# Patient Record
Sex: Male | Born: 1960 | Race: White | Hispanic: No | Marital: Married | Smoking: Never smoker
Health system: Southern US, Community
[De-identification: ages and names within clinical notes are randomized; demographics above are authoritative.]

## PROBLEM LIST (undated history)

## (undated) DIAGNOSIS — T82198A Other mechanical complication of other cardiac electronic device, initial encounter: Secondary | ICD-10-CM

## (undated) DIAGNOSIS — I447 Left bundle-branch block, unspecified: Secondary | ICD-10-CM

## (undated) DIAGNOSIS — K7689 Other specified diseases of liver: Secondary | ICD-10-CM

## (undated) DIAGNOSIS — F1010 Alcohol abuse, uncomplicated: Secondary | ICD-10-CM

## (undated) DIAGNOSIS — I428 Other cardiomyopathies: Secondary | ICD-10-CM

## (undated) DIAGNOSIS — I5022 Chronic systolic (congestive) heart failure: Secondary | ICD-10-CM

## (undated) DIAGNOSIS — R001 Bradycardia, unspecified: Secondary | ICD-10-CM

## (undated) DIAGNOSIS — Z95810 Presence of automatic (implantable) cardiac defibrillator: Secondary | ICD-10-CM

## (undated) DIAGNOSIS — I10 Essential (primary) hypertension: Secondary | ICD-10-CM

## (undated) HISTORY — DX: Bradycardia, unspecified: R00.1

## (undated) HISTORY — DX: Chronic systolic (congestive) heart failure: I50.22

## (undated) HISTORY — DX: Other mechanical complication of other cardiac electronic device, initial encounter: T82.198A

## (undated) HISTORY — DX: Presence of automatic (implantable) cardiac defibrillator: Z95.810

## (undated) HISTORY — DX: Alcohol abuse, uncomplicated: F10.10

## (undated) HISTORY — DX: Left bundle-branch block, unspecified: I44.7

## (undated) HISTORY — DX: Other specified diseases of liver: K76.89

## (undated) HISTORY — DX: Other cardiomyopathies: I42.8

## (undated) HISTORY — DX: Essential (primary) hypertension: I10

---

## 2002-09-12 ENCOUNTER — Ambulatory Visit (HOSPITAL_COMMUNITY): Admission: RE | Admit: 2002-09-12 | Discharge: 2002-09-12 | Payer: Self-pay | Admitting: Cardiology

## 2002-09-12 HISTORY — PX: CARDIAC CATHETERIZATION: SHX172

## 2004-06-16 ENCOUNTER — Ambulatory Visit (HOSPITAL_COMMUNITY): Admission: AD | Admit: 2004-06-16 | Discharge: 2004-06-16 | Payer: Self-pay | Admitting: Internal Medicine

## 2004-10-13 ENCOUNTER — Ambulatory Visit: Payer: Self-pay | Admitting: Internal Medicine

## 2004-11-11 ENCOUNTER — Ambulatory Visit: Payer: Self-pay | Admitting: Cardiology

## 2005-01-10 ENCOUNTER — Ambulatory Visit: Payer: Self-pay

## 2005-03-16 ENCOUNTER — Ambulatory Visit: Payer: Self-pay | Admitting: Internal Medicine

## 2005-04-12 ENCOUNTER — Ambulatory Visit: Payer: Self-pay | Admitting: Cardiology

## 2005-04-12 ENCOUNTER — Ambulatory Visit: Payer: Self-pay

## 2005-06-28 ENCOUNTER — Ambulatory Visit: Payer: Self-pay | Admitting: Cardiology

## 2005-11-16 ENCOUNTER — Ambulatory Visit: Payer: Self-pay | Admitting: Cardiology

## 2006-03-15 ENCOUNTER — Ambulatory Visit: Payer: Self-pay | Admitting: *Deleted

## 2006-07-12 ENCOUNTER — Ambulatory Visit: Payer: Self-pay | Admitting: Cardiology

## 2006-11-14 ENCOUNTER — Ambulatory Visit: Payer: Self-pay | Admitting: Cardiovascular Disease

## 2006-11-22 ENCOUNTER — Ambulatory Visit: Payer: Self-pay | Admitting: Internal Medicine

## 2006-12-18 ENCOUNTER — Ambulatory Visit: Payer: Self-pay | Admitting: Cardiology

## 2006-12-18 LAB — CONVERTED CEMR LAB
AST: 60 units/L — ABNORMAL HIGH (ref 0–37)
Calcium: 9.5 mg/dL (ref 8.4–10.5)
Chloride: 102 meq/L (ref 96–112)
Glomerular Filtration Rate, Af Am: 94 mL/min/{1.73_m2}
Glucose, Bld: 115 mg/dL — ABNORMAL HIGH (ref 70–99)
Sodium: 138 meq/L (ref 135–145)
TSH: 1.46 microintl units/mL (ref 0.35–5.50)

## 2007-01-15 ENCOUNTER — Encounter: Payer: Self-pay | Admitting: Cardiology

## 2007-01-15 ENCOUNTER — Ambulatory Visit: Payer: Self-pay

## 2007-03-14 ENCOUNTER — Ambulatory Visit: Payer: Self-pay | Admitting: Internal Medicine

## 2007-03-22 ENCOUNTER — Ambulatory Visit: Payer: Self-pay

## 2007-05-10 ENCOUNTER — Ambulatory Visit: Payer: Self-pay | Admitting: Cardiology

## 2007-06-04 ENCOUNTER — Ambulatory Visit: Payer: Self-pay | Admitting: Cardiology

## 2007-07-11 ENCOUNTER — Ambulatory Visit: Payer: Self-pay | Admitting: Cardiology

## 2007-07-11 LAB — CONVERTED CEMR LAB
ALT: 155 units/L — ABNORMAL HIGH (ref 0–53)
Albumin: 3.7 g/dL (ref 3.5–5.2)
Alkaline Phosphatase: 41 units/L (ref 39–117)
BUN: 14 mg/dL (ref 6–23)
Bilirubin, Direct: 0.1 mg/dL (ref 0.0–0.3)
CO2: 29 meq/L (ref 19–32)
Calcium: 8.9 mg/dL (ref 8.4–10.5)
Chloride: 102 meq/L (ref 96–112)
GFR calc Af Amer: 105 mL/min
Glucose, Bld: 117 mg/dL — ABNORMAL HIGH (ref 70–99)
HDL: 31.4 mg/dL — ABNORMAL LOW (ref >39.0)
Total CHOL/HDL Ratio: 8.2
Total Protein: 6.9 g/dL (ref 6.0–8.3)
VLDL: 161 mg/dL — ABNORMAL HIGH (ref 0–40)

## 2007-08-09 ENCOUNTER — Ambulatory Visit: Payer: Self-pay | Admitting: Cardiology

## 2007-08-20 ENCOUNTER — Ambulatory Visit: Payer: Self-pay | Admitting: Gastroenterology

## 2007-08-20 LAB — CONVERTED CEMR LAB
AST: 105 units/L — ABNORMAL HIGH (ref 0–37)
Albumin: 4.1 g/dL (ref 3.5–5.2)
Angiotensin 1 Converting Enzyme: 13 units/L (ref 9–67)
Anti Nuclear Antibody(ANA): NEGATIVE
Ferritin: 512.6 ng/mL — ABNORMAL HIGH (ref 22.0–322.0)
Hep B S Ab: NEGATIVE
Hepatitis B Surface Ag: NEGATIVE
Saturation Ratios: 20.4 % (ref 20.0–50.0)
Total Protein: 7.5 g/dL (ref 6.0–8.3)
Transferrin: 266.6 mg/dL (ref >=212.0)

## 2007-08-22 ENCOUNTER — Ambulatory Visit (HOSPITAL_COMMUNITY): Admission: RE | Admit: 2007-08-22 | Discharge: 2007-08-22 | Payer: Self-pay | Admitting: Gastroenterology

## 2007-09-11 ENCOUNTER — Ambulatory Visit: Payer: Self-pay | Admitting: Cardiology

## 2007-09-17 ENCOUNTER — Ambulatory Visit: Payer: Self-pay | Admitting: Gastroenterology

## 2007-10-10 ENCOUNTER — Ambulatory Visit: Payer: Self-pay | Admitting: Cardiology

## 2007-10-22 ENCOUNTER — Encounter: Admission: RE | Admit: 2007-10-22 | Discharge: 2007-10-22 | Payer: Self-pay | Admitting: Gastroenterology

## 2007-11-07 ENCOUNTER — Ambulatory Visit: Payer: Self-pay | Admitting: Cardiology

## 2007-11-07 ENCOUNTER — Ambulatory Visit: Payer: Self-pay | Admitting: Internal Medicine

## 2007-12-13 ENCOUNTER — Ambulatory Visit: Payer: Self-pay | Admitting: Cardiology

## 2008-01-10 ENCOUNTER — Ambulatory Visit: Payer: Self-pay | Admitting: Cardiology

## 2008-02-07 ENCOUNTER — Ambulatory Visit: Payer: Self-pay | Admitting: Cardiology

## 2008-02-14 ENCOUNTER — Ambulatory Visit: Payer: Self-pay | Admitting: Cardiology

## 2008-02-14 DIAGNOSIS — I509 Heart failure, unspecified: Secondary | ICD-10-CM | POA: Insufficient documentation

## 2008-02-14 DIAGNOSIS — I10 Essential (primary) hypertension: Secondary | ICD-10-CM

## 2008-02-14 DIAGNOSIS — K7689 Other specified diseases of liver: Secondary | ICD-10-CM

## 2008-02-14 DIAGNOSIS — I428 Other cardiomyopathies: Secondary | ICD-10-CM | POA: Insufficient documentation

## 2008-02-14 DIAGNOSIS — I447 Left bundle-branch block, unspecified: Secondary | ICD-10-CM | POA: Insufficient documentation

## 2008-02-14 HISTORY — DX: Other specified diseases of liver: K76.89

## 2008-02-14 LAB — CONVERTED CEMR LAB
Calcium: 9.5 mg/dL (ref 8.4–10.5)
Chloride: 102 meq/L (ref 96–112)
Creatinine, Ser: 1.1 mg/dL (ref 0.4–1.5)
GFR calc Af Amer: 94 mL/min
Potassium: 4.3 meq/L (ref 3.5–5.1)
Sodium: 139 meq/L (ref 135–145)

## 2008-03-05 ENCOUNTER — Ambulatory Visit: Payer: Self-pay | Admitting: Cardiology

## 2008-04-15 ENCOUNTER — Ambulatory Visit: Payer: Self-pay | Admitting: Cardiology

## 2008-05-07 ENCOUNTER — Ambulatory Visit: Payer: Self-pay

## 2008-05-13 ENCOUNTER — Ambulatory Visit: Payer: Self-pay | Admitting: Cardiology

## 2008-06-04 ENCOUNTER — Ambulatory Visit: Payer: Self-pay | Admitting: Cardiology

## 2008-07-02 ENCOUNTER — Ambulatory Visit: Payer: Self-pay | Admitting: Cardiology

## 2008-08-06 ENCOUNTER — Ambulatory Visit: Payer: Self-pay | Admitting: Cardiology

## 2008-09-03 ENCOUNTER — Ambulatory Visit: Payer: Self-pay | Admitting: Cardiology

## 2008-10-07 ENCOUNTER — Ambulatory Visit: Payer: Self-pay | Admitting: Cardiology

## 2008-10-07 ENCOUNTER — Ambulatory Visit: Payer: Self-pay | Admitting: Internal Medicine

## 2008-10-10 ENCOUNTER — Encounter: Payer: Self-pay | Admitting: Gastroenterology

## 2008-11-13 ENCOUNTER — Ambulatory Visit: Payer: Self-pay | Admitting: Cardiology

## 2008-11-13 LAB — CONVERTED CEMR LAB
CO2: 28 meq/L (ref 19–32)
Calcium: 9.2 mg/dL (ref 8.4–10.5)
Sodium: 138 meq/L (ref 135–145)

## 2008-12-17 ENCOUNTER — Encounter: Payer: Self-pay | Admitting: Cardiology

## 2008-12-17 ENCOUNTER — Ambulatory Visit: Payer: Self-pay

## 2009-01-15 ENCOUNTER — Encounter: Payer: Self-pay | Admitting: Internal Medicine

## 2009-05-05 ENCOUNTER — Telehealth: Payer: Self-pay | Admitting: Cardiology

## 2009-06-18 DIAGNOSIS — F1010 Alcohol abuse, uncomplicated: Secondary | ICD-10-CM | POA: Insufficient documentation

## 2009-06-18 HISTORY — DX: Alcohol abuse, uncomplicated: F10.10

## 2009-06-19 ENCOUNTER — Ambulatory Visit: Payer: Self-pay | Admitting: Cardiology

## 2009-12-18 ENCOUNTER — Ambulatory Visit: Payer: Self-pay | Admitting: Internal Medicine

## 2010-01-08 ENCOUNTER — Ambulatory Visit (HOSPITAL_COMMUNITY)
Admission: RE | Admit: 2010-01-08 | Discharge: 2010-01-08 | Payer: Self-pay | Source: Home / Self Care | Admitting: Internal Medicine

## 2010-01-08 ENCOUNTER — Ambulatory Visit: Payer: Self-pay

## 2010-01-08 ENCOUNTER — Encounter: Payer: Self-pay | Admitting: Internal Medicine

## 2010-01-08 ENCOUNTER — Ambulatory Visit: Payer: Self-pay | Admitting: Cardiovascular Disease

## 2010-01-11 ENCOUNTER — Telehealth (INDEPENDENT_AMBULATORY_CARE_PROVIDER_SITE_OTHER): Payer: Self-pay | Admitting: *Deleted

## 2010-01-21 ENCOUNTER — Ambulatory Visit: Payer: Self-pay | Admitting: Cardiology

## 2010-09-03 ENCOUNTER — Encounter (INDEPENDENT_AMBULATORY_CARE_PROVIDER_SITE_OTHER): Payer: Self-pay | Admitting: *Deleted

## 2010-12-08 ENCOUNTER — Ambulatory Visit: Admission: RE | Admit: 2010-12-08 | Discharge: 2010-12-08 | Payer: Self-pay | Source: Home / Self Care

## 2011-01-04 NOTE — Assessment & Plan Note (Signed)
Summary: 3 mo f/u   CC:  3 month rov.  Marland Kitchen  History of Present Illness: Mr. Cutbirth is seen in follow up   nonischemic cardiomyopathy congestive heart failure with LBBB for which he implanted, for primary prevention, an  ICD. Prior cardiac catheterization in October of 2003 revealed an ejection fraction of 10% and normal coronary arteries. . His most recent echocardiogram was performed in January of 2010. Ejection fraction was 25-30%. He continues with mild dyspnea on exertion.  there has been no edema and no chest pain. Marland Kitchen  His ICD has not fired.  Current Medications (verified): 1)  Ramipril 10 Mg Caps (Ramipril) .Marland Kitchen.. 1 Tab By Mouth Two Times A Day 2)  Spironolactone 25 Mg Tabs (Spironolactone) .... Take One Tablet By Mouth Daily 3)  Lanoxin 0.25 Mg Tabs (Digoxin) .Marland Kitchen.. 1 Tab By Mouth Once Daily 4)  Metoprolol Tartrate 100 Mg Tabs (Metoprolol Tartrate) .Marland Kitchen.. 1 1/2 Tab By Mouth Two Times A Day 5)  Aspirin Ec 325 Mg Tbec (Aspirin) .... Take One Tablet By Mouth Daily 6)  Wellbutrin Xl 150 Mg Xr24h-Tab (Bupropion Hcl) .... Take 1 Tablet By Mouth Once A Day 7)  Lovaza 1 Gm Caps (Omega-3-Acid Ethyl Esters) .... Take Two Capsules Two Times A Day 8)  Welchol 3.75 Gm Pack (Colesevelam Hcl) .... Take 3 Capsules Two Times A Day  Allergies (verified): 1)  ! Pcn  Past History:  Past Medical History: Last updated: 06/18/2009 Current Problems:  HYPERTENSION (ICD-401.9) CONGESTIVE HEART FAILURE, SYSTOLIC, CHRONIC (ICD-428.0) BUNDLE BRANCH BLOCK, LEFT (ICD-426.3) CARDIOMYOPATHY (ICD-425.4) FATTY LIVER DISEASE (ICD-571.8) AICD-Medtronic Maximo 7232  Past Surgical History: Last updated: 06/18/2009 cardiac catheterization-09/12/2002 Status post implantable cardioverter defibrillator-Medtronic Maximo 7232  Social History: Last updated: 06/19/2009 Married  Tobacco Use - No.  Alcohol Use - yes  Vital Signs:  Patient profile:   50 year old male Height:      68 inches Weight:      187  pounds BMI:     28.54 Pulse rate:   76 / minute Pulse rhythm:   regular BP sitting:   126 / 80  (left arm) Cuff size:   regular  Vitals Entered By: Judithe Modest CMA (December 18, 2009 11:45 AM)  Physical Exam  General:  The patient was alert and oriented in no acute distress. HEENT Normal.  Neck veins were flat, carotids were brisk.  Lungs were clear.  Heart sounds were regular without murmurs or gallops.  Abdomen was soft with active bowel sounds. There is no clubbing cyanosis or edema. Skin Warm and dry    EKG  Procedure date:  05/15/2009  Findings:      sinus rhythm with left bundle branch block   ICD Specifications Following MD:  Sherryl Manges, MD     Referring MD:  Jens Som ICD Vendor:  Medtronic     ICD Model Number:  7232     ICD Serial Number:  [id number] ICD DOI:  06/16/2004     ICD Implanting MD:  Sherryl Manges, MD  Lead 1:    Location: [facility]     DOI: 06/16/2004     Model #: [id number]     Serial #: [id number]     Status: active  Indications::  NICM   ICD Follow Up Remote Check?  No Battery Voltage:  2.99 V     Charge Time:  8.17 seconds     Underlying rhythm:  SR ICD Dependent:  No       ICD Device Measurements  Right Ventricle:  Amplitude: 14.3 mV, Impedance: 736 ohms, Threshold: 1.0 V at 0.4 msec Shock Impedance: 63/82 ohms   Episodes Shock:  0     ATP:  0     Nonsustained:  0     Ventricular Pacing:  <0.1%  Brady Parameters Mode VVI     Lower Rate Limit:  40      Tachy Zones VF:  207     VT:  240 FVT VIA VF     VT1:  182 (MONITOR)     Next Cardiology Appt Due:  03/05/2010 Tech Comments:  Normal device function.  No changes made today.  [phone] lead stable.  SIC-0.  Updated letter and magnet given to patient.  Alert tones demonstrated.  Pt prefers office visits to [facility].  ROV 3 months device clinic. Gypsy Balsam RN BSN  December 18, 2009 12:06 PM   Impression & Recommendations:  Problem # 1:  CONGESTIVE HEART FAILURE, SYSTOLIC, CHRONIC (ICD-428.0) The  patient has chronic congestive symptoms. He has left bundle branch block. He may well benefit from CRT upgrade either based on standard criteria or based on the MADIT-CRT criteria. We will plan to undertake any echo cardiogram to look at his left ventricular function and is scheduled to come back and see Dr. Jens Som as anticipated in the next month or so. In the event that his left ventricular function remains low CRT is worth considering. If it has improved that we can delay. There are issues related to his defibrillator (see below) that will require some degree of intervention he was generator replacement or with revision of his lead system His updated medication list for this problem includes:    Ramipril 10 Mg Caps (Ramipril) .Marland Kitchen... 1 tab by mouth two times a day    Spironolactone 25 Mg Tabs (Spironolactone) .Marland Kitchen... Take one tablet by mouth daily    Lanoxin 0.25 Mg Tabs (Digoxin) .Marland Kitchen... 1 tab by mouth once daily    Metoprolol Tartrate 100 Mg Tabs (Metoprolol tartrate) .Marland Kitchen... 1 1/2 tab by mouth two times a day    Aspirin Ec 325 Mg Tbec (Aspirin) .Marland Kitchen... Take one tablet by mouth daily  Orders: EKG w/ Interpretation (93000) Echocardiogram (Echo)  Problem # 2:  SPRINT RUEAVWU [id number] (WGN-562.13) the patient has a 6949-lead which is associated with a percent risk of fracture. If we were to revise the system giving A. LV lead then we would fix the 6949-lead issue at the same time.  the patient was given instructions and a magnet today for the 6949  Problem # 3:  IMPLANTATION OF DEFIBRILLATOR, MEDTRONIC MAXIMO 7232 (ICD-V45.02) Device parameters and data were reviewed and no changes were made  Problem # 4:  CARDIOMYOPATHY (ICD-425.4) he is stable on his current medications. His updated medication list for this problem includes:    Ramipril 10 Mg Caps (Ramipril) .Marland Kitchen... 1 tab by mouth two times a day    Spironolactone 25 Mg Tabs (Spironolactone) .Marland Kitchen... Take one tablet by mouth daily    Lanoxin 0.25 Mg  Tabs (Digoxin) .Marland Kitchen... 1 tab by mouth once daily    Metoprolol Tartrate 100 Mg Tabs (Metoprolol tartrate) .Marland Kitchen... 1 1/2 tab by mouth two times a day    Aspirin Ec 325 Mg Tbec (Aspirin) .Marland Kitchen... Take one tablet by mouth daily  Orders: Echocardiogram (Echo)  Patient Instructions: 1)  Your physician has requested that you have an echocardiogram.  Echocardiography is a painless test that uses sound waves to create images of your heart.  It provides your doctor with information about the size and shape of your heart and how well your heart's chambers and valves are working.  This procedure takes approximately one hour. There are no restrictions for this procedure. 2)  Your physician recommends that you schedule a follow-up appointment in: 4 weeks with Dr. Jens Som.

## 2011-01-04 NOTE — Letter (Signed)
Summary: Device-Delinquent Check  [facility], Main Office  [address]   [place], [place] [address]   Phone: [phone]  Fax: [phone]     September 03, 2010 MRN: [id number]   [facility] [address] [place], [place]  [address]   Dear Mr. [facility],  According to our records, you have not had your implanted device checked in the recommended period of time.  We are unable to determine appropriate device function without checking your device on a regular basis.  Please call our office to schedule an appointment with Dr Graciela Husbands,  as soon as possible.  If you are having your device checked by another physician, please call us so that we may update our records.  Thank you, Letta Moynahan, EMT  September 03, 2010 9:52 AM  ] [facility]

## 2011-01-04 NOTE — Letter (Signed)
Summary: [employer] - Release for Activity  [employer] - Release for Activity   Imported By: Marylou Mccoy 06/22/2010 14:00:07  _____________________________________________________________________  External Attachment:    Type:   Image     Comment:   External Document

## 2011-01-04 NOTE — Progress Notes (Signed)
Summary:  ECHO results  Phone Note Outgoing Call   Call placed by: Duncan Dull, RN, BSN,  January 11, 2010 4:12 PM Call placed to: Patient Summary of Call: Called patient and left message on machine  To discuss ECHO results Duncan Dull, RN, BSN  January 11, 2010 4:13 PM   Follow-up for Phone Call        Pt aware Follow-up by: Duncan Dull, RN, BSN,  January 12, 2010 9:24 AM

## 2011-01-04 NOTE — Assessment & Plan Note (Signed)
Summary: 4 WKS/DMILLER   History of Present Illness: Robert Massey is a pleasant gentleman who has a history of nonischemic cardiomyopathy and he has had a prior ICD. Prior cardiac catheterization in October of 2003 revealed an ejection fraction of 10% and normal coronary arteries.  His most recent echocardiogram was performed in December of 2010. Ejection fraction was 45-50%. There was mild left atrial enlargement. There is mild mitral regurgitation. Since I last saw him in July of 2010 he has dyspnea with more extreme activities but not with routine activities. It is relieved with rest. There is no associated chest pain. There is no orthopnea, PND, pedal edema, syncope or ICD discharge. There is no chest pain.  Current Medications (verified): 1)  Ramipril 10 Mg Caps (Ramipril) .Marland Kitchen.. 1 Tab By Mouth Two Times A Day 2)  Spironolactone 25 Mg Tabs (Spironolactone) .... Take One Tablet By Mouth Daily 3)  Lanoxin 0.25 Mg Tabs (Digoxin) .Marland Kitchen.. 1 Tab By Mouth Once Daily 4)  Metoprolol Tartrate 100 Mg Tabs (Metoprolol Tartrate) .Marland Kitchen.. 1 1/2 Tab By Mouth Two Times A Day 5)  Aspirin Ec 325 Mg Tbec (Aspirin) .... Take One Tablet By Mouth Daily 6)  Wellbutrin Xl 150 Mg Xr24h-Tab (Bupropion Hcl) .... Take 1 Tablet By Mouth Once A Day 7)  Lovaza 1 Gm Caps (Omega-3-Acid Ethyl Esters) .... Take Two Capsules Two Times A Day 8)  Welchol 3.75 Gm Pack (Colesevelam Hcl) .... Take 3 Capsules Two Times A Day  Allergies: 1)  ! Pcn  Past History:  Past Medical History: Reviewed history from 06/18/2009 and no changes required. Current Problems:  HYPERTENSION (ICD-401.9) CONGESTIVE HEART FAILURE, SYSTOLIC, CHRONIC (ICD-428.0) BUNDLE BRANCH BLOCK, LEFT (ICD-426.3) CARDIOMYOPATHY (ICD-425.4) FATTY LIVER DISEASE (ICD-571.8) AICD-Medtronic Maximo 7232  Past Surgical History: Reviewed history from 06/18/2009 and no changes required. cardiac catheterization-09/12/2002 Status post implantable cardioverter  defibrillator-Medtronic Maximo 7232  Social History: Reviewed history from 06/19/2009 and no changes required. Married  Tobacco Use - No.  Alcohol Use - yes  Review of Systems       Fatigue but no fevers or chills, productive cough, hemoptysis, dysphasia, odynophagia, melena, hematochezia, dysuria, hematuria, rash, seizure activity, orthopnea, PND, pedal edema, claudication. Remaining systems are negative.   Vital Signs:  Patient profile:   50 year old male Height:      68 inches Weight:      188 pounds BMI:     28.69 Pulse rate:   110 / minute Resp:     14 per minute BP sitting:   160 / 89  (left arm)  Vitals Entered By: Kem Parkinson (January 21, 2010 10:45 AM)  Physical Exam  General:  Well-developed well-nourished in no acute distress.  Skin is warm and dry.  HEENT is normal.  Neck is supple. No thyromegaly.  Chest is clear to auscultation with normal expansion.  Cardiovascular exam is regular but tachycardic.  Abdominal exam nontender or distended. No masses palpated. Extremities show no edema. neuro grossly intact    EKG  Procedure date:  01/21/2010  Findings:      Sinus tachycardia at a rate of 119. Left bundle branch block.   ICD Specifications Following MD:  Sherryl Manges, MD     Referring MD:  Jens Som ICD Vendor:  Medtronic     ICD Model Number:  7232     ICD Serial Number:  [id number] H ICD DOI:  06/16/2004     ICD Implanting MD:  Sherryl Manges, MD  Lead 1:    Location:  RV     DOI: 06/16/2004     Model #: [id number]     Serial #: [id number]     Status: active  Indications::  NICM   ICD Follow Up ICD Dependent:  No      Brady Parameters Mode VVI     Lower Rate Limit:  40      Tachy Zones VF:  207     VT:  240 FVT VIA VF     VT1:  182 (MONITOR)     Impression & Recommendations:  Problem # 1:  CARDIOMYOPATHY (ICD-425.4) Most recent echocardiogram showed improvement in LV function. Continue beta blocker, ACE inhibitor and spironolactone. Obtain most  recent laboratories from Dr. Robyne Peers office. His updated medication list for this problem includes:    Ramipril 10 Mg Caps (Ramipril) .Marland Kitchen... 1 tab by mouth two times a day    Spironolactone 25 Mg Tabs (Spironolactone) .Marland Kitchen... Take one tablet by mouth daily    Lanoxin 0.25 Mg Tabs (Digoxin) .Marland Kitchen... 1 tab by mouth once daily    Metoprolol Tartrate 100 Mg Tabs (Metoprolol tartrate) .Marland Kitchen... 1 1/2 tab by mouth two times a day    Aspirin Ec 325 Mg Tbec (Aspirin) .Marland Kitchen... Take one tablet by mouth daily    Amlodipine Besylate 5 Mg Tabs (Amlodipine besylate) .Marland Kitchen... Take one tablet by mouth daily  Problem # 2:  IMPLANTATION OF DEFIBRILLATOR, MEDTRONIC MAXIMO 7232 (ICD-V45.02) Management per EP.  Problem # 3:  HYPERTENSION (ICD-401.9) Blood pressure elevated. Add Norvasc 5 mg p.o. daily. His updated medication list for this problem includes:    Ramipril 10 Mg Caps (Ramipril) .Marland Kitchen... 1 tab by mouth two times a day    Spironolactone 25 Mg Tabs (Spironolactone) .Marland Kitchen... Take one tablet by mouth daily    Metoprolol Tartrate 100 Mg Tabs (Metoprolol tartrate) .Marland Kitchen... 1 1/2 tab by mouth two times a day    Aspirin Ec 325 Mg Tbec (Aspirin) .Marland Kitchen... Take one tablet by mouth daily    Amlodipine Besylate 5 Mg Tabs (Amlodipine besylate) .Marland Kitchen... Take one tablet by mouth daily  Problem # 4:  ALCOHOL ABUSE (ICD-305.00) Discussed importance of avoiding.  Problem # 5:  BUNDLE BRANCH BLOCK, LEFT (ICD-426.3)  His updated medication list for this problem includes:    Ramipril 10 Mg Caps (Ramipril) .Marland Kitchen... 1 tab by mouth two times a day    Metoprolol Tartrate 100 Mg Tabs (Metoprolol tartrate) .Marland Kitchen... 1 1/2 tab by mouth two times a day    Aspirin Ec 325 Mg Tbec (Aspirin) .Marland Kitchen... Take one tablet by mouth daily    Amlodipine Besylate 5 Mg Tabs (Amlodipine besylate) .Marland Kitchen... Take one tablet by mouth daily  Problem # 6:  FATTY LIVER DISEASE (ICD-571.8)  Patient Instructions: 1)  Your physician recommends that you schedule a follow-up appointment in:9  MONTHS 2)  Your physician has recommended you make the following change in your medication: START AMLODIPINE 5MG  ONE TABLET ONCE DAILY Prescriptions: AMLODIPINE BESYLATE 5 MG TABS (AMLODIPINE BESYLATE) Take one tablet by mouth daily  #90 x 3   Entered by:   Deliah Goody, RN   Authorized by:   Ferman Hamming, MD, [facility]   Signed by:   Deliah Goody, RN on 01/21/2010   Method used:   Electronically to        [facility] (retail)       [address] [place], [place]  [address]       Ph: [phone]  Fax: [phone]   RxID:   [id number]

## 2011-01-06 NOTE — Procedures (Signed)
Summary: pcp   Current Medications (verified): 1)  Ramipril 10 Mg Caps (Ramipril) .Marland Kitchen.. 1 Tab By Mouth Two Times A Day 2)  Spironolactone 25 Mg Tabs (Spironolactone) .... Take One Tablet By Mouth Daily 3)  Lanoxin 0.25 Mg Tabs (Digoxin) .Marland Kitchen.. 1 Tab By Mouth Once Daily 4)  Metoprolol Tartrate 100 Mg Tabs (Metoprolol Tartrate) .Marland Kitchen.. 1 1/2 Tab By Mouth Two Times A Day 5)  Aspirin Ec 325 Mg Tbec (Aspirin) .... Take One Tablet By Mouth Daily 6)  Wellbutrin Xl 150 Mg Xr24h-Tab (Bupropion Hcl) .... Take 1 Tablet By Mouth Once A Day 7)  Lovaza 1 Gm Caps (Omega-3-Acid Ethyl Esters) .... Take Two Capsules Two Times A Day 8)  Welchol 3.75 Gm Pack (Colesevelam Hcl) .... Take 3 Capsules Two Times A Day 9)  Amlodipine Besylate 5 Mg Tabs (Amlodipine Besylate) .... Take One Tablet By Mouth Daily  Allergies (verified): 1)  ! Pcn   ICD Specifications Following MD:  Sherryl Manges, MD     Referring MD:  CRENSHAW ICD Vendor:  Medtronic     ICD Model Number:  7232     ICD Serial Number:  [id number] ICD DOI:  06/16/2004     ICD Implanting MD:  Sherryl Manges, MD  Lead 1:    Location: RV     DOI: 06/16/2004     Model #: 4034     Serial #: [id number]     Status: active  Indications::  NICM   ICD Follow Up Battery Voltage:  3.00 V     Charge Time:  8.33 seconds     Underlying rhythm:  SR ICD Dependent:  No       ICD Device Measurements Right Ventricle:  Amplitude: 10.7 mV, Impedance: 696 ohms, Threshold: 1.0 V at 0.2 msec Shock Impedance: 57/76 ohms   Episodes MS Episodes:  0     Shock:  0     ATP:  0     Nonsustained:  37     Atrial Therapies:  0 Ventricular Pacing:  0.1%  Brady Parameters Mode VVI     Lower Rate Limit:  40      Tachy Zones VF:  207     VT:  240 FVT VIA VF     VT1:  182 (MONITOR)     Next Cardiology Appt Due:  03/07/2011 Tech Comments:  19 SVT EPISODES--LONGEST WAS 2.9 MINUTES.  18 NST EPISODES.  NORMAL DEVICE FUNCTION.  NO CHANGES MADE. ROV IN 3 MTHS W/SK. Vella Kohler  December 08, 2010 4:34 PM

## 2011-01-07 ENCOUNTER — Encounter: Payer: Self-pay | Admitting: Cardiology

## 2011-01-07 ENCOUNTER — Ambulatory Visit (INDEPENDENT_AMBULATORY_CARE_PROVIDER_SITE_OTHER): Payer: Medicare PPO | Admitting: Cardiology

## 2011-01-07 ENCOUNTER — Ambulatory Visit: Admit: 2011-01-07 | Payer: Self-pay | Admitting: Cardiology

## 2011-01-07 DIAGNOSIS — R0989 Other specified symptoms and signs involving the circulatory and respiratory systems: Secondary | ICD-10-CM

## 2011-01-07 DIAGNOSIS — I10 Essential (primary) hypertension: Secondary | ICD-10-CM

## 2011-01-07 DIAGNOSIS — I428 Other cardiomyopathies: Secondary | ICD-10-CM

## 2011-01-11 LAB — CONVERTED CEMR LAB
BUN: 13 mg/dL (ref 6–23)
CO2: 27 meq/L (ref 19–32)
Chloride: 94 meq/L — ABNORMAL LOW (ref 96–112)
Creatinine, Ser: 1.04 mg/dL (ref 0.40–1.50)
Potassium: 4.3 meq/L (ref 3.5–5.3)
Sodium: 132 meq/L — ABNORMAL LOW (ref 135–145)

## 2011-01-12 NOTE — Assessment & Plan Note (Signed)
Summary: rov per pt call/lg appt confirm=mj   Vital Signs:  Patient profile:   50 year old male Height:      68 inches Weight:      184 pounds BMI:     28.08 Pulse rate:   67 / minute Resp:     14 per minute BP sitting:   134 / 80  (left arm)  Vitals Entered By: Kem Parkinson (January 07, 2011 3:37 PM)  History of Present Illness: Robert Massey is a pleasant gentleman who has a history of nonischemic cardiomyopathy and he has had a prior ICD. Prior cardiac catheterization in October of 2003 revealed an ejection fraction of 10% and normal coronary arteries.  His most recent echocardiogram was performed in December of 2010. Ejection fraction was 45-50%. There was mild left atrial enlargement. There is mild mitral regurgitation. Since I last saw him in Feb 2011 he has dyspnea with more extreme activities but not with routine activities. It is relieved with rest. There is no associated chest pain. There is no  PND, pedal edema, syncope or ICD discharge. There is no chest pain. Mild orthopnea.  Current Medications (verified): 1)  Ramipril 10 Mg Caps (Ramipril) .Marland Kitchen.. 1 Tab By Mouth Two Times A Day 2)  Spironolactone 25 Mg Tabs (Spironolactone) .... Take One Tablet By Mouth Daily 3)  Lanoxin 0.25 Mg Tabs (Digoxin) .... Has Not Taken 1 Tab By Mouth Once Daily 4)  Metoprolol Tartrate 100 Mg Tabs (Metoprolol Tartrate) .Marland Kitchen.. 1 1/2 Tab By Mouth Two Times A Day 5)  Aspirin Ec 325 Mg Tbec (Aspirin) .... Take One Tablet By Mouth Daily 6)  Wellbutrin Xl 150 Mg Xr24h-Tab (Bupropion Hcl) .... Take 1 Tablet By Mouth Once A Day 7)  Amlodipine Besylate 5 Mg Tabs (Amlodipine Besylate) .... Take One Tablet By Mouth Daily 8)  Flax   Oil (Flaxseed (Linseed)) .Marland Kitchen.. 1 Tab By Mouth Two Times A Day  Allergies: 1)  ! Pcn  Past History:  Past Medical History: HYPERTENSION (ICD-401.9) CONGESTIVE HEART FAILURE, SYSTOLIC, CHRONIC (ICD-428.0) BUNDLE BRANCH BLOCK, LEFT (ICD-426.3) CARDIOMYOPATHY (ICD-425.4) FATTY  LIVER DISEASE (ICD-571.8) AICD-Medtronic Maximo 7232  Past Surgical History: Reviewed history from 06/18/2009 and no changes required. cardiac catheterization-09/12/2002 Status post implantable cardioverter defibrillator-Medtronic Maximo 7232  Social History: Reviewed history from 06/19/2009 and no changes required. Married  Tobacco Use - No.  Alcohol Use - yes  Review of Systems       no fevers or chills, productive cough, hemoptysis, dysphasia, odynophagia, melena, hematochezia, dysuria, hematuria, rash, seizure activity, orthopnea, PND, pedal edema, claudication. Remaining systems are negative.   Physical Exam  General:  Well-developed well-nourished in no acute distress.  Skin is warm and dry.  HEENT is normal.  Neck is supple. No thyromegaly.  Chest is clear to auscultation with normal expansion. ICD in left chest Cardiovascular exam is regular rate and rhythm.  Abdominal exam nontender or distended. No masses palpated. Extremities show no edema. neuro grossly intact    Impression & Recommendations:  Problem # 1:  IMPLANTATION OF DEFIBRILLATOR, MEDTRONIC MAXIMO 7232 (ICD-V45.02) Magement per electrophysiology.  Problem # 2:  CARDIOMYOPATHY (ICD-425.4) Continued ACE inhibitor, beta blocker. Repeat echocardiogram when he returns in one year. His updated medication list for this problem includes:    Ramipril 10 Mg Caps (Ramipril) .Marland Kitchen... 1 tab by mouth two times a day    Spironolactone 25 Mg Tabs (Spironolactone) .Marland Kitchen... Take one tablet by mouth daily    Lanoxin 0.25 Mg Tabs (Digoxin) .Marland KitchenMarland KitchenMarland KitchenMarland Kitchen  Has not taken 1 tab by mouth once daily    Metoprolol Tartrate 100 Mg Tabs (Metoprolol tartrate) .Marland Kitchen... 1 1/2 tab by mouth two times a day    Aspirin Ec 325 Mg Tbec (Aspirin) .Marland Kitchen... Take one tablet by mouth daily    Amlodipine Besylate 5 Mg Tabs (Amlodipine besylate) .Marland Kitchen... Take one tablet by mouth daily  Orders: T-BNP  (B Natriuretic Peptide) ([id number])  His updated medication  list for this problem includes:    Ramipril 10 Mg Caps (Ramipril) .Marland Kitchen... 1 tab by mouth two times a day    Spironolactone 25 Mg Tabs (Spironolactone) .Marland Kitchen... Take one tablet by mouth daily    Lanoxin 0.25 Mg Tabs (Digoxin) ..... Has not taken 1 tab by mouth once daily    Metoprolol Tartrate 100 Mg Tabs (Metoprolol tartrate) .Marland Kitchen... 1 1/2 tab by mouth two times a day    Aspirin Ec 325 Mg Tbec (Aspirin) .Marland Kitchen... Take one tablet by mouth daily    Amlodipine Besylate 5 Mg Tabs (Amlodipine besylate) .Marland Kitchen... Take one tablet by mouth daily  Problem # 3:  HYPERTENSION (ICD-401.9) Blood pressure controlled on present medications. Check potassium and renal function. Will also check BNP. His updated medication list for this problem includes:    Ramipril 10 Mg Caps (Ramipril) .Marland Kitchen... 1 tab by mouth two times a day    Spironolactone 25 Mg Tabs (Spironolactone) .Marland Kitchen... Take one tablet by mouth daily    Metoprolol Tartrate 100 Mg Tabs (Metoprolol tartrate) .Marland Kitchen... 1 1/2 tab by mouth two times a day    Aspirin Ec 325 Mg Tbec (Aspirin) .Marland Kitchen... Take one tablet by mouth daily    Amlodipine Besylate 5 Mg Tabs (Amlodipine besylate) .Marland Kitchen... Take one tablet by mouth daily  Orders: T-Basic Metabolic Panel [phone])  Problem # 4:  BUNDLE BRANCH BLOCK, LEFT (ICD-426.3)  His updated medication list for this problem includes:    Ramipril 10 Mg Caps (Ramipril) .Marland Kitchen... 1 tab by mouth two times a day    Metoprolol Tartrate 100 Mg Tabs (Metoprolol tartrate) .Marland Kitchen... 1 1/2 tab by mouth two times a day    Aspirin Ec 325 Mg Tbec (Aspirin) .Marland Kitchen... Take one tablet by mouth daily    Amlodipine Besylate 5 Mg Tabs (Amlodipine besylate) .Marland Kitchen... Take one tablet by mouth daily  Problem # 5:  FATTY LIVER DISEASE (ICD-571.8)  Patient Instructions: 1)  Your physician wants you to follow-up in: ONE YEAR  You will receive a reminder letter in the mail two months in advance. If you don't receive a letter, please call our office to schedule the follow-up  appointment.   Orders Added: 1)  T-Basic Metabolic Panel [[id number]] 2)  T-BNP  (B Natriuretic Peptide) [[id number]]     EKG  Procedure date:  01/07/2011  Findings:      Sinus rhythm with left bundle branch block.

## 2011-01-19 ENCOUNTER — Telehealth (INDEPENDENT_AMBULATORY_CARE_PROVIDER_SITE_OTHER): Payer: Self-pay | Admitting: *Deleted

## 2011-01-24 ENCOUNTER — Telehealth: Payer: Self-pay | Admitting: Cardiology

## 2011-01-26 NOTE — Progress Notes (Signed)
  DDS Request received sent to [facility]  January 19, 2011 2:12 PM

## 2011-02-01 NOTE — Progress Notes (Signed)
Summary: talk about letter from insurance  Phone Note Call from Patient Call back at [phone]   Caller: Patient Reason for Call: Talk to Nurse, Talk to Doctor Summary of Call: pt got a letter from insurance stating he needs another provider and pt wants to talk to Dr. Jens Som and see if he should do this or not Initial call taken by: Omer Jack,  January 24, 2011 11:22 AM  Follow-up for Phone Call        letter from [employer].  Requesting pt be seen by a physician at "[facility]" states letter also states for the pt to let them know if his physician instructs pt not to go for exam.  I explained to pt that if he should choose not to have the physical as requested - they may be able to deny his disability claim.  Instructed pt that I will forward information to Dr Jens Som for his review and comments. Follow-up by: Charolotte Capuchin, RN,  January 24, 2011 11:35 AM     Appended Document: talk about letter from insurance pt needs to see their for disability claim. we would be  happy to forward any records concerning cardiac status  Appended Document: talk about letter from insurance pt aware

## 2011-03-28 ENCOUNTER — Encounter: Payer: Self-pay | Admitting: *Deleted

## 2011-04-19 NOTE — Assessment & Plan Note (Signed)
[facility]                         GASTROENTEROLOGY OFFICE NOTE   [name], Massey                   MRN:          [id number]  DATE:09/17/2007                            DOB:          01/06/1961    This is a return office visit for elevated transaminases.  His wife is  with him today.  An hepatic function panel from August 20, 2007,  showed an AST of 105, and an ALT of 222.  The remainder of his liver  function tests were unremarkable.  His ferritin was mildly elevated at  512.6, however, his iron studies were normal.  The remainder of the  serologic workup for liver disease was negative.  Abdominal ultrasound  imaging showed an increase in echogenicity diffusely consistent with  fatty liver.  A recent cholesterol was elevated at 259 and triglycerides  elevated at 804.  He has no gastrointestinal complaints.   CURRENT MEDICATIONS:  Listed on the chart, updated and reviewed.   MEDICATION ALLERGIES:  PENICILLIN leading to rash.   PHYSICAL EXAMINATION:  GENERAL:  No acute distress.  VITAL SIGNS:  Weight 185 pounds, blood pressure is 122/78, pulse 72 and  regular.  He is not re-examined.   ASSESSMENT/PLAN:  Elevated transaminases. Fatty infiltration of the  liver is likely the cause of his elevated transaminases and it is likely  due to hyperlipidemia, excess body weight  and alcohol useage.  We will  plan to monitor his liver function tests every 3 months and consider a  liver biopsy in the near future if his LFTs do not improve.  He is  advised to begin treatment for hyperlipidemia, discontinue alcohol  useage and to begin a low fat, weight loss diet.  A dietician referral  is made.  He is cleared to start lipid lowering medications as  clinically indicated by his primary MD or Cardiologist.  LFT should be  monitored per the routine protocol for any lipid lowering drugs used.     Robert Massey. Robert Dar, MD, [facility]  Electronically Signed    MTS/MedQ  DD: 09/17/2007  DT: 09/17/2007  Job #: [id number]   cc:   Robert Massey. Robert Som, MD, [facility]  Robert Massey

## 2011-04-19 NOTE — Assessment & Plan Note (Signed)
[facility]                         ELECTROPHYSIOLOGY OFFICE NOTE   LYNDEL, SARATE                   MRN:          [id number]  DATE:11/06/2007                            DOB:          Apr 28, 1961    HISTORY OF PRESENT ILLNESS:  Mr. Heyward has nonischemic heart disease.  He is status post ICD implantation for primary prevention. His major  complaint is that he continues to have fatigue and shortness of breath  with activity.  He is able to climb stairs, but it sounds like he labors  through this.  He is able to rake leaves, but once he starts moving the  leaf pile, he cannot do it for more than a couple of minutes.  He does  not have edema or nocturnal dyspnea.   MEDICATIONS:  1. Altace 5 mg.  2. Lanoxin 0.25 mg.  3. Spironolactone 25 mg.  4. Toprol 150 mg.  5. __________ study drug.  6. Wellbutrin.   PHYSICAL EXAMINATION:  VITAL SIGNS:  Blood pressure is mildly elevated  at 144/90, pulse 81.  LUNGS:  Clear.  HEART:  Sounds were regular.  NECK:  Veins were flat.  EXTREMITIES:  Without edema.  ABDOMEN:  Soft.   Interrogation of his Medtronic MAXIMA ICD demonstrates an R wave of 11.2  with impedance of 648, threshold of 1 volt of 0.2, high voltage  impedance of 56/70, battery voltage 3.11, and the LIA software was  installed.   IMPRESSION:  1. Nonischemic cardiomyopathy.  2. Class II to III congestive heart failure.  3. Left bundle branch block.  4. Status post ICD for #1.  5. Borderline hypertension.   Mr. Kon has nonischemic cardiomyopathy and congestive heart failure  which I think is more limiting than he allows.  That is to say, I think  he has markedly limited his activity to accommodate his capacity.  He  and I discussed the central role of CPX in trying to objectify his  limitations.  He is agreeable.  He is to see Dr. Jens Som sometime in  February, so we will plan  to have him do the CPX a couple of weeks prior  to that visit and the two  of them can review the  data at that time.  If it looks like he may benefit from CRT upgrade, I  would rather see him at that time to help with that.     Duke Salvia, MD, [facility]  Electronically Signed    SCK/MedQ  DD: 11/07/2007  DT: 11/08/2007  Job #: [id number]

## 2011-04-19 NOTE — Assessment & Plan Note (Signed)
[facility]                         GASTROENTEROLOGY OFFICE NOTE   Robert Massey, Robert Massey                   MRN:          [id number]  DATE:08/20/2007                            DOB:          Apr 10, 1961    REFERRING PHYSICIAN:  Madolyn Frieze. Jens Som, MD, [facility]   REASON FOR CONSULTATION:  Elevated transaminases.   HISTORY OF PRESENT ILLNESS:  Mr. Lavine is a 50 year old white male  followed by Drs. Olga Millers and [name].  He has had abnormal  transaminases noted since July of 2005, initially AST 68 and ALT 153;  September, 2005, AST 86, ALT 194;  May, 2006, AST 61, ALT 130;  January,  2008, AST 60, ALT 126; August, 2008, AST 86 and ALT 155.  He has had  elevated cholesterol and triglycerides noted in the past with recent  cholesterol of 259 and triglycerides at 804.  His wife and he state that  he drinks about a 6-pack of beer 3 times a week.  He denies any history  of liver disease, hepatitis, tattoos, blood transfusions, IV drug use  and he denies any family history of liver disease.  He is followed by  Dr. Jens Som for a nonischemic cardiomyopathy and he has been advised to  discontinue alcohol use on several occasions.  He has also been advised  to follow up with a gastroenterologist in [place] for further  evaluation of his liver function test abnormalities but he has not done  so.  He relates occasional episodes of heartburn, indigestion and  epigastric pain that occur approximately once every month or two, the  symptoms are brief and are mild. He notes no dysphagia, odynophagia,  change in bowel habits, change in the stool caliber, melena,  hematochezia or weight loss.   FAMILY HISTORY:  Negative for colon cancer, colon polyps or inflammatory  bowel disease.   PAST MEDICAL HISTORY:  1. Nonischemic cardiomyopathy status post ICD.  2. Left bundle branch block.  3. Chronic systolic congestive heart failure.  4. History of  hypertension.  5. Warcef cell drug study - Coumadin versus aspirin.   CURRENT MEDICATIONS:  Listed on the chart, updated and reviewed.   MEDICATION ALLERGIES:  PENICILLIN.   SOCIAL HISTORY AND REVIEW OF SYSTEMS:  Per the handwritten form.   PHYSICAL EXAM:  A well-developed, well-nourished white male in no acute  distress.  Height 5 feet 8 inches, weight 184 pounds, blood pressure  144/76, pulse 80 and regular.  HEENT EXAM:  Anicteric sclera, oropharynx clear.  CHEST:  Clear to auscultation bilaterally.  CARDIAC:  Regular rate and rhythm without murmurs.  ABDOMEN:  Soft, nontender, nondistended, normoactive bowel sounds.  No  palpable organomegaly, masses or hernias.  Liver span approximately 12  to 13 cm in the right midclavicular line.  EXTREMITIES:  Without clubbing, cyanosis, edema.  NEUROLOGIC:  Alert and oriented x3.  Grossly nonfocal.   ASSESSMENT AND PLAN:  Elevated transaminases.  ALT greater than AST.  Rule out fatty infiltration of the liver, chronic viral and metabolic  liver diseases.  Fatty infiltration is quite likely given his  hyperlipidemia, alcohol use and excess  body weight.  Medication side  effects are also another possibility.  The pattern of transaminase  elevation is not typical for alcoholic hepatitis, however, I have  advised him to totally discontinue all alcohol usage due to his liver  and cardiac disease.  He will return to Drs. Crenshaw and Dough for  appropriate management of his hyperlipidemia.  Obtain standard blood  work and serologies for chronic liver disease evaluation and schedule an  abdominal ultrasound.  Return office visit in 3 to 4 weeks.     Venita Lick. Russella Dar, MD, [facility]  Electronically Signed    MTS/MedQ  DD: 08/20/2007  DT: 08/20/2007  Job #: [id number]   cc:   Madolyn Frieze. Jens Som, MD, [facility]

## 2011-04-19 NOTE — Assessment & Plan Note (Signed)
[name]                            CARDIOLOGY OFFICE NOTE   Robert Massey, Robert Massey                   MRN:          [id number]  DATE:02/07/2008                            DOB:          February 24, 1961    [name] returns for followup today. He is a gentleman with a  nonischemic cardiomyopathy.  Since I last saw him, he is doing well.  He  does have some dyspnea on exertion.  There is no orthopnea, PND, pedal  edema, palpitations, pre-syncope, syncope, chest pain or ICD discharge.  Note, he saw Dr. Graciela Husbands last in December.  CPX was recommended, but the  patient declined.   PRESENT MEDICATIONS:  1. Altace 5 mg p.o. b.i.d.  2. Lanoxin 0.25 mg daily.  3. Spironolactone 25 mg daily.  4. Toprol 150 mg daily.  5. Wellbutrin 150 mg daily.  6. Zocor 20 mg daily.   PHYSICAL EXAM:  Today shows a blood pressure of 140/88.  His pulse is  80.  HEENT:  Normal.  NECK: Is supple.  CHEST:  Clear.  CARDIOVASCULAR: Regular rate and rhythm.  ABDOMEN:  Shows no tenderness.  EXTREMITIES:  Show no edema.   Electrocardiogram shows sinus rhythm with left bundle branch block.   DIAGNOSES:  1. Nonischemic cardiomyopathy - Robert Massey is doing reasonable well      from a symptomatic standpoint.  However, his blood pressure is      elevated and I have asked him to increase his Altace to 10 mg p.o.      b.i.d.  We will check a BMET in 1 week to follow his potassium and      renal function.  He will continue his digoxin, spironolactone and      Toprol.  2. Status post ICD - He is following with Dr. Graciela Husbands for this.  3. History of left bundle branch block.  4. Elevated liver functions - He is now being followed by Dr. Russella Dar in      Gastroenterology for this particular issue.  Dr. Sol Passer is also      checking his liver functions as he recently added Zocor.  5. History of alcohol use.   He will see [place] back in 6 months.     Madolyn Frieze Jens Som, MD, [facility]  Electronically Signed    BSC/MedQ  DD: 02/07/2008  DT: 02/07/2008  Job #: [id number]   cc:   [name]

## 2011-04-19 NOTE — Assessment & Plan Note (Signed)
[facility]                         ELECTROPHYSIOLOGY OFFICE NOTE   GALEN, Robert Massey                   MRN:          [id number]  DATE:10/07/2008                            DOB:          Aug 14, 1961    HISTORY OF PRESENT ILLNESS:  Robert Massey is seen in followup for  nonischemic cardiomyopathy for which he has an ICD implanted.  He also  has significant fatigue exercise intolerance, probably class IIB to  IIIA, manifested by fatigue, shortness of breath with exertion.  This  occurs in the setting of left bundle-branch block.  Previously CPX was  recommended to objectively assess his limitation; he declined this.   He also has some obstructive breathing patterns at night, daytime  fatigue, daytime somnolence.   MEDICATIONS:  1. Lanoxin 0.25.  2. Aldactone 25.  3. Toprol 150.  4. Warcef Study Drug.  5. Wellbutrin.  6. Altace 10 b.i.d.  7. Zocor.   PHYSICAL EXAMINATION:  VITAL SIGNS:  His blood pressure was 131/80.  His  pulse was 60, his weight was 189, which is up 4 pounds in the last few  months. LUNGS:  Clear.  Massey:  Massey sounds were regular.  ABDOMEN:  Soft.  EXTREMITIES:  No edema.   Interrogation of Medtronic Maximo ICD demonstrates an R-wave of 11.4  with impedance of 656, a threshold of 2.5 at 0.06 milliseconds, high  voltage impedance of 52/66.  Battery voltage 3.07.  There are no  intercurrent episodes.  He has a 6949 lead in place.   IMPRESSION:  1. Nonischemic cardiomyopathy.  2. Chronic systolic Massey failure - Class IIB to IIIA.  3. Left bundle-branch block.  4. Status post implantable cardioverter-defibrillator for primary      prevention.  5. Daytime somnolence, fatigue, and a sleep disorder breathing, rule      out sleep apnea.  6. 6949 lead in place with Lead Integrity Alert active.   DISCUSSION:  Robert Massey is stable from arrhythmia point of view.  I do  wonder whether he would benefit from CRT upgrade,  although he has  declined CPX previously.  I will defer this evaluation to Dr. Jens Som.   In addition, it sounds like he may have obstructive sleep apnea.  I  recommended that he think about a sleep study and we will plan to have a  talk about this with Dr. Jens Som when he  returns in just a couple of weeks' time.  We will see him again in 3  months' time in the Device Clinic.     Duke Salvia, MD, [facility]  Electronically Signed    SCK/MedQ  DD: 10/07/2008  DT: 10/07/2008  Job #: [id number]

## 2011-04-19 NOTE — Assessment & Plan Note (Signed)
[facility]                            CARDIOLOGY OFFICE NOTE   Robert, Massey                   MRN:          [id number]  DATE:07/11/2007                            DOB:          09/10/1961    [name] is a gentleman who has a history of a nonischemic  cardiomyopathy.  A catheterization in October of 2003 revealed normal  coronary arteries.  His most recent echocardiogram was performed on  January 15, 2007.  His ejection fraction was 20%.  There was mild  mitral regurgitation.  Since I last saw him he has mild dyspnea on  exertion but there is no orthopnea, PND, pedal edema, palpitations,  presyncope, syncope, or chest pain.  Note he does continue to consume  alcohol.   MEDICATIONS:  1. Altace 5 mg p.o. b.i.d.  2. Lanoxin 0.25 mg p.o. daily.  3. Spironolactone 25 mg p.o. daily.  4. Toprol 150 mg p.o. daily.  5. Warcef study drug.  6. Wellbutrin 150 mg p.o. daily.   PHYSICAL EXAMINATION:  Blood pressure 131/79, his pulse is 73.  He  weighs 180 pounds.  HEENT:  Normal.  NECK:  Supple.  There are no bruits noted.  CHEST:  Clear.  CARDIOVASCULAR:  Reveals a regular rate and rhythm.  ABDOMEN:  Benign.  EXTREMITIES:  Show no edema.   DIAGNOSES:  1. Nonischemic cardiomyopathy - he will continue on the present dose      of Altace, digoxin, Spironolactone, and Toprol.  He is also on      Warcef study drug.  He continues to consume alcohol and I think it      is likely that this could be contributing to his cardiomyopathy.      We again discussed the importance of discontinuing this and he      appears to understand.  2. Status post ICD -  he will follow up with Dr. Graciela Husbands as scheduled in      December of this year.  3. History of left bundle branch block.  4. Chronic systolic congestive heart failure - he appears to be well-      compensated on his present medications.  5. Elevated liver functions - I have told him repeatedly in the  past      that he needs a GI evaluation for his increased liver functions.  I      have offered to make an appointment here in [place], but he and      his wife have stated that he will be seen in [place].  However, he      has failed to do this.  We will repeat his liver functions today      and I again told him that I would be happy to arrange an      appointment here or that he needs to be seen in [place].  He      stated that he would try to do this.  Note we will also check a      BMET, given his ACE inhibitor and Spironolactone use.  6. Alcohol abuse -  I again counseled him to discontinue this.   I will see him back in six months.     Robert Frieze Jens Som, MD, [facility]  Electronically Signed    BSC/MedQ  DD: 07/11/2007  DT: 07/11/2007  Job #: [id number]

## 2011-04-19 NOTE — Assessment & Plan Note (Signed)
[facility]                            CARDIOLOGY OFFICE NOTE   Robert Massey, Robert Massey                   MRN:          [id number]  DATE:11/13/2008                            DOB:          1961-02-11    Robert Massey is a 47-year gentleman who has a history of nonischemic  cardiomyopathy and he has had a prior ICD.  Since I last saw him, he  does have dyspnea with more moderate activities, but not with routine  activities on the house.  There is no orthopnea, PND, pedal edema,  palpitations, presyncope, syncope, or chest pain.  His symptoms are  essentially unchanged since I saw him on February 07, 2008.  His last  echocardiogram was performed on January 15, 2007.  His ejection  fraction was 20%.  There was mild mitral regurgitation.   PRESENT MEDICATIONS:  1. Lanoxin 0.25 mg p.o. daily.  2. Spironolactone 25 mg p.o. daily.  3. Toprol 150 mg p.o. daily.  4. Wellbutrin 150 mg p.o. daily.  5. Altace 10 mg p.o. b.i.d.  6. Welchol.  7. Lovaza.   PHYSICAL EXAMINATION:  VITAL SIGNS:  Today shows a blood pressure of  120/77.  His pulse is 59.  He weighs 185 pounds.  HEENT:  Normal.  NECK:  Supple.  CHEST:  Clear.  CARDIOVASCULAR:  Regular rate and rhythm.  ABDOMEN:  No tenderness.  EXTREMITIES:  No edema.   His electrocardiogram shows a sinus rhythm at a rate of 54.  There is  left bundle-branch block.   DIAGNOSES:  1. Nonischemic cardiomyopathy - Robert Massey appears to be euvolemic      on examination today.  He will continue with his Altace, Lanoxin,      spironolactone, and Toprol.  I will check a BMET today to follow      his potassium and renal function.  His symptoms appear to be class      II.  If his symptoms worsen in the future, then we can consider an      upgrade to cardiac resynchronization therapy.  2. Status post implantable cardioverter-defibrillator - Managed per      Dr. Graciela Husbands.  3. Left bundle-branch block.  4. Hypertension -  His blood pressure is adequately controlled.  5. Elevated liver functions - This is being managed by Dr. Sol Passer and      he has been seen by Dr. Russella Dar in the past for this issue as well.  6. History of alcohol abuse.   We will see him back in 6 months or sooner, if necessary.     Madolyn Frieze Jens Som, MD, [facility]  Electronically Signed    BSC/MedQ  DD: 11/13/2008  DT: 11/14/2008  Job #: [id number]   cc:   [name]

## 2011-04-22 NOTE — Assessment & Plan Note (Signed)
[facility]                         ELECTROPHYSIOLOGY OFFICE NOTE   Robert Massey, Robert Massey                   MRN:          [id number]  DATE:11/22/2006                            DOB:          Apr 16, 1961    [name] is seen.  He is status post ICD implantation for primary  prevention in the context of nonischemic cardiomyopathy.  He has a 6949  lead.  He has no complaints.   EXAMINATION:  His blood pressure is mildly elevated as it has been.  At  recent visit, it was 164/94 in August, 132/70 last month, and 148/87  today.  His pulse was 68.  LUNGS:  Clear.  Heart sounds were regular.  EXTREMITIES:  Without edema.   Interrogation of his Medtronic [phone] lead demonstrated that he has a R-  wave of 12.2 and an impedence of 696 and a threshold of 1 V at 0.2 with  a __________ voltage impedence of 56.  Battery voltage was 3.14.  There  were no intercurrent episodes.  His device was reprogrammed consistent  with the recommendations related to the lead.   IMPRESSION:  1. Nonischemic cardiomyopathy.  2. Status post implantable cardioverter defibrillator for #1.  3. Advised of a recall on the [phone] lead with appropriate reprogramming      issues discussed.  4. Left bundle branch block.  5. Hypertension.   [name] is stable.  He will be following up with Dr. Jens Som next  month.  He does snore and I have asked him to ask his wife whether he  has obstructive breathing while he snores as this maybe a reversible  factor in his hypertension.   He will be seen in 4 months in the Device Clinic to establish a remote  followup.     Duke Salvia, MD, [facility]  Electronically Signed    SCK/MedQ  DD: 11/22/2006  DT: 11/22/2006  Job #: [id number]   cc:   [name]

## 2011-04-22 NOTE — Op Note (Signed)
NAME:  Robert Massey, Robert Massey                      ACCOUNT NO.:  [id number]   MEDICAL RECORD NO.:  [id number]                   PATIENT TYPE:  OIB   LOCATION:  4733                                 FACILITY:  [facility]   PHYSICIAN:  Duke Salvia, M.D.               DATE OF BIRTH:  Apr 11, 1961   DATE OF PROCEDURE:  06/16/2004  DATE OF DISCHARGE:  06/16/2004                                 OPERATIVE REPORT   PREOPERATIVE DIAGNOSIS:  Cardiomyopathy.   PROCEDURE:  ICD implantation with intraoperative defibrillation threshold  testing.   Following the obtainment of informed consent, the patient was brought to the  electrophysiology laboratory and placed on the fluoroscopic table in the  supine position.  After routine prep and drape of the left upper chest,  lidocaine was infiltrated in the prepectoral subclavicular region.  A  longitudinal incision was made and carried down to the level of the  prepectoral fascia using electrocautery and sharp dissection.  A pocket was  formed similarly.  Hemostasis was obtained.   Thereafter, attention was turned to gaining access to the extrathoracic  subclavian vein which was accomplished without difficulty without the  aspiration of air or puncturing the artery.  A single venipuncture was  accomplished, a guide wire was placed and retained and a 7 French tear-away  introducer sheath was placed which was then passed a Medtronics 69-49-65 cm  active fixation ventricular lead, serial #[id number].  Under fluoroscopic  guidance it was manipulated to the right ventricular apex where the bipolar  R-wave was 19 mV with a pacing impedance of 1400 ohms, a pacing threshold of  0.9 v at 0.5 msec, current threshold of 0.9 mA.  With these acceptable  parameters recorded, the lead was attached to a Medtronics Maximo 723CX ICD  with a serial #[id number].  Through the device the bipolar R-wave was 7.6  mV with a pacing impedance of 1184 ohms, a pacing threshold of 1 v at  0.4  msec.  The R-wave was measured again through the PSA, confirming that it was  stable at 19 mV.   With these acceptable parameters recorded, defibrillation threshold testing  was undertaken.  Ventricular fibrillation was induced via the T-wave shock.  After a total duration of 6 seconds, a 15 joule shock was delivered through  a measured resistance of 45 ohms, terminating ventricular fibrillation and  restoring sinus rhythm.  After waiting 5-6 minutes, ventricular fibrillation  was induced via the T-wave shock again.  After a total duration of 6  seconds, a 15 joule shock was delivered through a measured resistance of 45  ohms, terminating ventricular fibrillation and restoring sinus rhythm.  With  these acceptable parameters recorded, the system was implanted.  The pocket  was copiously irrigated with antibiotic containing saline solution.  Hemostasis was ensured.  The lead and the pulse generator were placed in the  pocket and secured to the prepectoral fascia.  The  wound was washed, dried  and benzoin and a Steri-Strip dressing was applied.  Needle counts, sponge  counts and instrument counts were correct at the end of the procedure  according to the staff.   The patient tolerated the procedure without apparent complication.                                               Duke Salvia, M.D.   SCK/MEDQ  D:  07/27/2004  T:  07/28/2004  Job:  [id number]   cc:   Electrophysiology Laboratory

## 2011-04-22 NOTE — Cardiovascular Report (Signed)
   NAME:  Robert Massey, Robert Massey                      ACCOUNT NO.:  [id number]   MEDICAL RECORD NO.:  [id number]                   PATIENT TYPE:  OIB   LOCATION:  2899                                 FACILITY:  [facility]   PHYSICIAN:  Salvadore Farber, M.D. [facility]         DATE OF BIRTH:  Dec 18, 1960   DATE OF PROCEDURE:  09/12/2002  DATE OF DISCHARGE:                              CARDIAC CATHETERIZATION   PROCEDURES:  Right and left heart catheterization, left ventriculography,  coronary angiography.   INDICATIONS:  Congestive heart failure, cardiomyopathy, rule out ischemic  etiology.   DIAGNOSTIC TECHNIQUE:  Under 2% lidocaine local anesthesia, an 8 French  sheath was placed in the right femoral vein and a 6 French sheath placed in  the right femoral artery using the modified Seldinger technique.  Diagnostic  angiography was performed using a Williams right catheter and a JL4 catheter  to engage the circumflex and a JL 3.5 catheter to engage the LAD.  A 6  French pigtail catheter was advanced in the left ventricle. Pressures were  measured.  Ventriculography was performed by power injection.  The patient  tolerated the procedure well and was transferred to the holding room in  stable condition.   FINDINGS:  1. LV:  84/11/21.  2. EF equaled 10% with global hypokinesis.  3. Mitral regurgitation of 2+ without mitral stenosis.  4. No aortic stenosis on pullback.   HEMODYNAMICS:  RA:  5, RV:  27/3/7, PA:  28/14/20, PCW 19/20/16.   Left main:  A very short vessel which is normal.   LAD:  A large vessel giving rise to one large diagonal branch. It is normal.   Circumflex:  Large dominant vessel giving rise to three obtuse marginals and  the posterior descending artery.  It is normal.   RCA:  A small nondominant vessel which is normal.    IMPRESSION/PLAN:  Angiographically normal coronary arteries. His  cardiomyopathy is therefore nonischemic in etiology.  Pressures are  relatively  normal at present. We will plan on continued medical therapy.  The patient is to follow up with Dr. Jens Som in five days time.                                                 Salvadore Farber, M.D. [facility]    WED/MEDQ  D:  09/12/2002  T:  09/15/2002  Job:  [id number]   cc:   Olga Millers, MD [facility]  [address]  [place]  [place] [address]  Fax: [phone]

## 2011-04-22 NOTE — Assessment & Plan Note (Signed)
[facility]                            CARDIOLOGY OFFICE NOTE   Robert Massey, Robert Massey                   MRN:          [id number]  DATE:12/18/2006                            DOB:          July 14, 1961    Robert Massey returns for followup today.  Since I last saw him, he has  mild dyspnea on exertion, but there is no significant orthopnea, PND,  pedal edema, palpitations, pre-syncope, syncope, or chest pain.   MEDICATIONS AT PRESENT:  1. Altace 5 mg p.o. b.i.d.  2. Lanoxin 0.25 mg p.o. daily.  3. Spironolactone 25 mg p.o. daily.  4. Toprol 150 mg p.o. daily.  5. WARCEF study drugs.   PHYSICAL EXAM:  Blood pressure 128/79, pulse is 61.  He weighs 180  pounds.  NECK:  Supple.  CHEST:  Clear.  CARDIOVASCULAR:  Regular rate and rhythm.  EXTREMITIES:  No edema.   DIAGNOSES:  1. Nonischemic cardiomyopathy.  2. Status post implantable cardioverter defibrillator.  3. History of left bundle branch block.  4. Elevated liver functions.  5. Alcohol abuse.   PLAN:  Robert Massey is doing well from a symptomatic standpoint and his  blood pressure is well controlled.  We will make no changes in his  medication.  I will check a CMET today to follow his potassium, renal  function, and LFTs.  Note, we have asked him to see a gastroenterologist  about his increased LFTs in the past, but he continues to not follow  this advice.  We again asked him to do that.  He also apparently is  drinking approximately 6 beers per day.  I think it is very possible  that he has some alcohol-induced cardiomyopathy.  We discussed the  benefit of discontinuing that, and he will continue to try to avoid  that.  We will plan to repeat his echocardiogram to re-quantify his left  ventricular function.  We will otherwise see him back in 6 months.     Madolyn Frieze Jens Som, MD, [facility]  Electronically Signed    BSC/MedQ  DD: 12/18/2006  DT: 12/18/2006  Job #: [id number]

## 2011-04-22 NOTE — Assessment & Plan Note (Signed)
[facility]                         ELECTROPHYSIOLOGY OFFICE NOTE   TREVONTAE, LINDAHL                   MRN:          [id number]  DATE:03/22/2007                            DOB:          1961/04/17    Mr. Colasanti was seen today in the clinic on the 17th of April, 2008 for  a followup of his Medtronic model [phone] Maximo.  Date of implant was  June 16, 2004 for nonischemic cardiomyopathy.  On interrogation of his  device today, his battery voltage is 3.13 with a charge time of 7.70  seconds.  R waves measured 10 mV with a ventricular pacing threshold of  1 volt at 0.2 ms and a ventricular lead impedence of 696 ohm.  Shock  impedence was 55.  There were no episodes since last interrogation.  No  changes were made in his parameters.  He will be signed up today for  [facility], and we will send a transmission with a return office visit in  December of this year.      Altha Harm, LPN  Electronically Signed      Duke Salvia, MD, [facility]  Electronically Signed   PO/MedQ  DD: 03/22/2007  DT: 03/22/2007  Job #: [id number]

## 2011-04-22 NOTE — Discharge Summary (Signed)
NAME:  ARYN, KOPS                      ACCOUNT NO.:  [id number]   MEDICAL RECORD NO.:  [id number]                   PATIENT TYPE:  OIB   LOCATION:  4733                                 FACILITY:  [facility]   PHYSICIAN:  Duke Salvia, M.D.               DATE OF BIRTH:  21-Oct-1961   DATE OF ADMISSION:  06/16/2004  DATE OF DISCHARGE:  06/16/2004                                 DISCHARGE SUMMARY   DISCHARGE DIAGNOSIS:  1. Normal coronary arteries by left heart catheterization.  2. Class II congestive heart failure.  3. Left bundle branch block.  4. Candidate for implantation of cardioverter defibrillator.   PROCEDURES:  June 16, 2004, implantation of Medtronic Maximow VR 7232-Cx,  Serial Number [id number].   DISCHARGE DISPOSITION:  Mr. Culpepper is ready for discharge after undergoing  bed rest required post placement of  cardioverter defibrillator today, July  13.  His pain is being well controlled with oral analgesia.  His device was  interrogated after the procedure, and no changes were necessary.  He is  having no drainage from the incision site.  He has been instructed in post  device convalescent care including left upper extremity motion over the next  six to eight weeks.  He has also been counseled not to drive for the next 10  to 14 days and not to bathe for the next week except for sponge bathing.  He  goes home on the following medications.   1. Warcef, which is a study drug.  2. Lanoxin 0.25 mg daily.  3. Coreg 25 mg twice daily.  4. Altace 5 mg twice daily.  5. Aldactone 25 mg daily.  6. Multivitamins daily.  7. Aspirin 325 mg daily.   The patient was supplied with an prescription for Percocet 5/325 mg 1 to 2  tablets every 4 to 6 hours as needed for pain at the pacer insertion site.   He will have followup with the pacer clinic at [facility] on July 28  at 9:30 in the morning.  At that time, the wound will be checked and the  pacer interrogated.  He  also has a followup with Dr. Duke Salvia  November 9 at 9 o'clock in the morning.  He knows to call [facility]  if he experiences discharge of his cardioverter and to call 911 if the  cardioverter discharges and he feels unwell.   BRIEF HISTORY:  Mr. Riddles is 50 year old male with known diagnosis and  treatment for Class II congestive heart failure.  He has a diagnosis of  nonischemic cardiomyopathy confirmed by left heart catheterization with  normal coronary anatomy.  It is not a familial disorder per his history.  The patient presents July 13 for implantation  of cardioverter defibrillator.  The patient was apparently considered for  the MADIT-CRT trial, but this would require implantation of a biventricular  device.  I feel that the patient  received only an implantable cardioverter  defibrillator at this hospitalization.      Maple Mirza, P.A.                    Duke Salvia, M.D.    GM/MEDQ  D:  06/16/2004  T:  06/17/2004  Job:  [id number]   cc:   [name], M.D.   Olga Millers, M.D. [facility]

## 2011-05-16 ENCOUNTER — Ambulatory Visit (INDEPENDENT_AMBULATORY_CARE_PROVIDER_SITE_OTHER): Payer: Medicare PPO | Admitting: *Deleted

## 2011-05-16 DIAGNOSIS — I428 Other cardiomyopathies: Secondary | ICD-10-CM

## 2011-05-16 NOTE — Progress Notes (Signed)
icd check in clinic  

## 2011-06-22 ENCOUNTER — Other Ambulatory Visit: Payer: Self-pay | Admitting: Cardiology

## 2011-08-18 ENCOUNTER — Encounter: Payer: Medicare PPO | Admitting: *Deleted

## 2011-08-31 ENCOUNTER — Telehealth: Payer: Self-pay | Admitting: Internal Medicine

## 2011-08-31 NOTE — Telephone Encounter (Signed)
Pt needs to have dental work in a week and they need to know if pt needs pre medication

## 2011-08-31 NOTE — Telephone Encounter (Signed)
No pre-meds needed  MD office aware

## 2011-09-01 ENCOUNTER — Other Ambulatory Visit: Payer: Self-pay | Admitting: Cardiology

## 2011-09-01 ENCOUNTER — Other Ambulatory Visit: Payer: Self-pay | Admitting: Internal Medicine

## 2011-09-15 ENCOUNTER — Ambulatory Visit (INDEPENDENT_AMBULATORY_CARE_PROVIDER_SITE_OTHER): Payer: Medicare PPO | Admitting: *Deleted

## 2011-09-15 ENCOUNTER — Encounter: Payer: Self-pay | Admitting: Internal Medicine

## 2011-09-15 DIAGNOSIS — I428 Other cardiomyopathies: Secondary | ICD-10-CM

## 2011-09-15 NOTE — Progress Notes (Signed)
icd checked in clinic 

## 2011-11-04 ENCOUNTER — Encounter: Payer: Self-pay | Admitting: *Deleted

## 2011-11-08 ENCOUNTER — Encounter: Payer: Self-pay | Admitting: Internal Medicine

## 2011-11-08 ENCOUNTER — Ambulatory Visit (INDEPENDENT_AMBULATORY_CARE_PROVIDER_SITE_OTHER): Payer: Medicare PPO | Admitting: Internal Medicine

## 2011-11-08 DIAGNOSIS — Z95810 Presence of automatic (implantable) cardiac defibrillator: Secondary | ICD-10-CM

## 2011-11-08 DIAGNOSIS — I498 Other specified cardiac arrhythmias: Secondary | ICD-10-CM

## 2011-11-08 DIAGNOSIS — R001 Bradycardia, unspecified: Secondary | ICD-10-CM | POA: Insufficient documentation

## 2011-11-08 DIAGNOSIS — I428 Other cardiomyopathies: Secondary | ICD-10-CM

## 2011-11-08 DIAGNOSIS — I509 Heart failure, unspecified: Secondary | ICD-10-CM

## 2011-11-08 DIAGNOSIS — T82198A Other mechanical complication of other cardiac electronic device, initial encounter: Secondary | ICD-10-CM | POA: Insufficient documentation

## 2011-11-08 HISTORY — DX: Presence of automatic (implantable) cardiac defibrillator: Z95.810

## 2011-11-08 LAB — ICD DEVICE OBSERVATION
BRDY-0002RV: 40 {beats}/min
DEV-0020ICD: NEGATIVE
RV LEAD AMPLITUDE: 9.7 mv
RV LEAD IMPEDENCE ICD: 552 Ohm
RV LEAD THRESHOLD: 1 V
TZAT-0001SLOWVT: 5
TZAT-0002SLOWVT: NEGATIVE
TZAT-0002SLOWVT: NEGATIVE
TZAT-0002SLOWVT: NEGATIVE
TZAT-0002SLOWVT: NEGATIVE
TZAT-0002SLOWVT: NEGATIVE
TZAT-0002SLOWVT: NEGATIVE
TZAT-0005FASTVT: 88 pct
TZAT-0012SLOWVT: 200 ms
TZAT-0012SLOWVT: 200 ms
TZAT-0012SLOWVT: 200 ms
TZAT-0018SLOWVT: NEGATIVE
TZAT-0018SLOWVT: NEGATIVE
TZAT-0018SLOWVT: NEGATIVE
TZAT-0019FASTVT: 8 V
TZAT-0019SLOWVT: 8 V
TZAT-0019SLOWVT: 8 V
TZAT-0019SLOWVT: 8 V
TZAT-0020SLOWVT: 1.6 ms
TZAT-0020SLOWVT: 1.6 ms
TZAT-0020SLOWVT: 1.6 ms
TZON-0003SLOWVT: 330 ms
TZON-0004SLOWVT: 16
TZON-0005SLOWVT: 12
TZON-0008FASTVT: 0 ms
TZON-0008SLOWVT: 0 ms
TZON-0010FASTVT: 30 ms
TZST-0001FASTVT: 4
TZST-0001FASTVT: 5
TZST-0003FASTVT: 35 J
VENTRICULAR PACING ICD: 0 pct

## 2011-11-08 NOTE — Patient Instructions (Addendum)
Your physician recommends that you schedule a follow-up appointment in: 3 months with device clinic.   Your physician has recommended you make the following change in your medication:  1) Stop digoxin. 2) Decrease metoprolol to 100mg  one tablet by mouth twice daily.

## 2011-11-08 NOTE — Progress Notes (Signed)
  HPI  Robert Massey is a 50 y.o. male Present Illness:  Robert Massey is seen in follow up nonischemic cardiomyopathy congestive heart failure with LBBB for which he implanted, for primary prevention, an ICD. Prior cardiac catheterization in October of 2003 revealed an ejection fraction of 10% and normal coronary arteries. . His most recent echocardiogram was performed in January of 2010. Ejection fraction was 25-30%. He continues with mild dyspnea on exertion. there has been no edema and no chest pain.   He does haowever have significant fatigue with some DOE  No edema    .   Past Medical History  Diagnosis Date  . Hypertension   . Systolic CHF, chronic   . LBBB (left bundle branch block)   . Cardiomyopathy   . AICD (automatic cardioverter/defibrillator) present     Medtronic Maximo 7232  . ALCOHOL ABUSE 06/18/2009  . FATTY LIVER DISEASE 02/14/2008    Past Surgical History  Procedure Date  . Cardiac catheterization 09/12/2002  . Cardiac defibrillator placement     Medtronic Maximo 7232    Current Outpatient Prescriptions  Medication Sig Dispense Refill  . amLODipine (NORVASC) 5 MG tablet Take 5 mg by mouth daily.        Marland Kitchen aspirin 325 MG tablet Take 325 mg by mouth daily.        Marland Kitchen buPROPion (WELLBUTRIN XL) 150 MG 24 hr tablet Take 150 mg by mouth daily.        . digoxin (LANOXIN) 0.25 MG tablet TAKE 1 TABLET DAILY  30 tablet  12  . Flax OIL Take by mouth daily.        . metoprolol (LOPRESSOR) 100 MG tablet TAKE 1 AND 1/2 TABLETS TWICE DAILY  270 tablet  2  . ramipril (ALTACE) 10 MG capsule TAKE 1 CAPSULE TWICE DAILY  180 capsule  2  . spironolactone (ALDACTONE) 25 MG tablet TAKE 1 TABLET DAILY  90 tablet  2    Allergies  Allergen Reactions  . Penicillins     Review of Systems negative except from HPI and PMH  Physical Exam Well developed and well nourished in no acute distress HENT normal E scleral and icterus clear Neck Supple JVP flat; carotids brisk and  full Clear to ausculation  Regular rate and rhythm, no murmurs gallops or rub Soft with active bowel sounds No clubbing cyanosis none Edema Alert and oriented, grossly normal motor and sensory function Skin Warm and Dry  Sinus brad with LBBB  Assessment and  Plan

## 2011-11-08 NOTE — Assessment & Plan Note (Signed)
The patient's device was interrogated.  The information was reviewed. No changes were made in the programming.    

## 2011-11-08 NOTE — Assessment & Plan Note (Addendum)
This may be the cause of fatigue or the bradycardia.  We will d/c dig as no afib and decrease BB from 150 >>100 bid  His avg HR is about 60 and worse over recent months  he would be a candidate for CRT upgrade

## 2011-11-08 NOTE — Assessment & Plan Note (Signed)
Stable and tones demonstrated

## 2011-11-08 NOTE — Assessment & Plan Note (Signed)
As above  Will need to interrogate device when he returns to see [facility]

## 2011-11-08 NOTE — Assessment & Plan Note (Signed)
As a above

## 2011-11-28 ENCOUNTER — Encounter: Payer: Self-pay | Admitting: Internal Medicine

## 2012-01-10 ENCOUNTER — Encounter: Payer: Self-pay | Admitting: Internal Medicine

## 2012-01-10 ENCOUNTER — Ambulatory Visit (INDEPENDENT_AMBULATORY_CARE_PROVIDER_SITE_OTHER): Payer: Medicare PPO | Admitting: Cardiology

## 2012-01-10 ENCOUNTER — Encounter: Payer: Self-pay | Admitting: Cardiology

## 2012-01-10 ENCOUNTER — Ambulatory Visit (INDEPENDENT_AMBULATORY_CARE_PROVIDER_SITE_OTHER): Payer: Medicare PPO | Admitting: *Deleted

## 2012-01-10 VITALS — BP 140/80 | HR 80 | Ht 68.0 in | Wt 160.0 lb

## 2012-01-10 DIAGNOSIS — Z95810 Presence of automatic (implantable) cardiac defibrillator: Secondary | ICD-10-CM

## 2012-01-10 DIAGNOSIS — I10 Essential (primary) hypertension: Secondary | ICD-10-CM

## 2012-01-10 DIAGNOSIS — R5383 Other fatigue: Secondary | ICD-10-CM | POA: Insufficient documentation

## 2012-01-10 DIAGNOSIS — R0609 Other forms of dyspnea: Secondary | ICD-10-CM

## 2012-01-10 DIAGNOSIS — I428 Other cardiomyopathies: Secondary | ICD-10-CM

## 2012-01-10 LAB — ICD DEVICE OBSERVATION
DEV-0020ICD: NEGATIVE
FVT: 0
TZAT-0001FASTVT: 1
TZAT-0001SLOWVT: 1
TZAT-0001SLOWVT: 2
TZAT-0001SLOWVT: 3
TZAT-0001SLOWVT: 4
TZAT-0001SLOWVT: 6
TZAT-0002SLOWVT: NEGATIVE
TZAT-0002SLOWVT: NEGATIVE
TZAT-0002SLOWVT: NEGATIVE
TZAT-0004FASTVT: 8
TZAT-0005FASTVT: 88 pct
TZAT-0011FASTVT: 10 ms
TZAT-0012SLOWVT: 200 ms
TZAT-0012SLOWVT: 200 ms
TZAT-0018SLOWVT: NEGATIVE
TZAT-0019SLOWVT: 8 V
TZAT-0019SLOWVT: 8 V
TZAT-0019SLOWVT: 8 V
TZAT-0020FASTVT: 1.6 ms
TZAT-0020SLOWVT: 1.6 ms
TZAT-0020SLOWVT: 1.6 ms
TZAT-0020SLOWVT: 1.6 ms
TZON-0003FASTVT: 250 ms
TZON-0008SLOWVT: 0 ms
TZON-0010AFLUTTER: 30 ms
TZON-0010FASTVT: 30 ms
TZON-0010VSLOWVT: 30 ms
TZST-0001FASTVT: 4
TZST-0003FASTVT: 25 J
TZST-0003FASTVT: 35 J
TZST-0003FASTVT: 35 J
TZST-0003FASTVT: 35 J

## 2012-01-10 LAB — BASIC METABOLIC PANEL
CO2: 29 mEq/L (ref 19–32)
Calcium: 9.4 mg/dL (ref 8.4–10.5)
Chloride: 105 mEq/L (ref 96–112)
Creatinine, Ser: 1 mg/dL (ref 0.4–1.5)
GFR: 89.78 mL/min (ref >60.00)
Glucose, Bld: 104 mg/dL — ABNORMAL HIGH (ref 70–99)

## 2012-01-10 LAB — BRAIN NATRIURETIC PEPTIDE: Pro B Natriuretic peptide (BNP): 13 pg/mL (ref 0.0–100.0)

## 2012-01-10 LAB — CBC WITH DIFFERENTIAL/PLATELET
Basophils Relative: 0.3 % (ref 0.0–3.0)
HCT: 46 % (ref 39.0–52.0)
Hemoglobin: 15.8 g/dL (ref 13.0–17.0)
MCV: 90.6 fl (ref 78.0–100.0)
Platelets: 241 10*3/uL (ref 150.0–400.0)
RDW: 13.1 % (ref 11.5–14.6)
WBC: 8.5 10*3/uL (ref 4.5–10.5)

## 2012-01-10 LAB — LIPID PANEL
Cholesterol: 229 mg/dL — ABNORMAL HIGH (ref 0–200)
HDL: 38.4 mg/dL — ABNORMAL LOW (ref >39.00)
Total CHOL/HDL Ratio: 6
Triglycerides: 274 mg/dL — ABNORMAL HIGH (ref 0.0–149.0)

## 2012-01-10 LAB — TSH: TSH: 0.79 u[IU]/mL (ref 0.35–5.50)

## 2012-01-10 NOTE — Assessment & Plan Note (Signed)
Managed by electrophysiology. Dr. Graciela Husbands decreased his beta blocker and discontinued his digoxin previously for bradycardia. Reinterrogate device today.

## 2012-01-10 NOTE — Patient Instructions (Signed)
Your physician wants you to follow-up in: ONE YEAR  You will receive a reminder letter in the mail two months in advance. If you don't receive a letter, please call our office to schedule the follow-up appointment.   Your physician has requested that you have an echocardiogram. Echocardiography is a painless test that uses sound waves to create images of your heart. It provides your doctor with information about the size and shape of your heart and how well your heart's chambers and valves are working. This procedure takes approximately one hour. There are no restrictions for this procedure.   Your physician recommends that you return for lab work in: TODAY  

## 2012-01-10 NOTE — Assessment & Plan Note (Signed)
Blood pressure controlled. Continue present medications. 

## 2012-01-10 NOTE — Progress Notes (Signed)
icd check in clinic  

## 2012-01-10 NOTE — Progress Notes (Signed)
   HPI:[name] is a pleasant gentleman who has a history of nonischemic cardiomyopathy and he has had a prior ICD. Prior cardiac catheterization in October of 2003 revealed an ejection fraction of 10% and normal coronary arteries.  His most recent echocardiogram was performed in December of 2010. Ejection fraction was 45-50%. There was mild left atrial enlargement. There is mild mitral regurgitation. Since I last saw him in Feb 2012 he was seen by Dr. Graciela Massey and noted to be bradycardic. His beta blocker was decreased and his digoxin discontinued. Since then, he complains of fatigue; he has dyspnea with more moderate activities but not with routine activities. It is relieved with rest. There is no associated chest pain. There is no  PND, pedal edema, syncope or ICD discharge. There is no chest pain.  Current Outpatient Prescriptions  Medication Sig Dispense Refill  . amLODipine (NORVASC) 5 MG tablet Take 5 mg by mouth daily.        Marland Kitchen aspirin 325 MG tablet Take 325 mg by mouth daily.        . Flax OIL Take by mouth daily.        . metoprolol (LOPRESSOR) 100 MG tablet Take one tablet by mouth twice daily.      . ramipril (ALTACE) 10 MG capsule TAKE 1 CAPSULE TWICE DAILY  180 capsule  2  . spironolactone (ALDACTONE) 25 MG tablet TAKE 1 TABLET DAILY  90 tablet  2     Past Medical History  Diagnosis Date  . Hypertension   . Systolic CHF, chronic   . LBBB (left bundle branch block)   . dilated cardiomyopathy   . AICD (automatic cardioverter/defibrillator) present     Medtronic Maximo 7232  . ALCOHOL ABUSE 06/18/2009  . FATTY LIVER DISEASE 02/14/2008  . 6949 lead   . Sinus bradycardia     Past Surgical History  Procedure Date  . Cardiac catheterization 09/12/2002  . Cardiac defibrillator placement     Medtronic Maximo 7232    History   Social History  . Marital Status: Married    Spouse Name: N/A    Number of Children: N/A  . Years of Education: N/A   Occupational History  . Not  on file.   Social History Main Topics  . Smoking status: Never Smoker   . Smokeless tobacco: Never Used  . Alcohol Use: Yes  . Drug Use: Not on file  . Sexually Active: Not on file   Other Topics Concern  . Not on file   Social History Narrative  . No narrative on file    ROS: no fevers or chills, productive cough, hemoptysis, dysphasia, odynophagia, melena, hematochezia, dysuria, hematuria, rash, seizure activity, orthopnea, PND, pedal edema, claudication. Remaining systems are negative.  Physical Exam: Well-developed well-nourished in no acute distress.  Skin is warm and dry.  HEENT is normal.  Neck is supple. No thyromegaly.  Chest is clear to auscultation with normal expansion.  Cardiovascular exam is regular rate and rhythm.  Abdominal exam nontender or distended. No masses palpated. Extremities show no edema. neuro grossly intact

## 2012-01-10 NOTE — Assessment & Plan Note (Signed)
Plan continue ACE inhibitor and beta blocker. Patient has discontinued alcohol use completely. Repeat echocardiogram to reassess LV function.

## 2012-01-10 NOTE — Assessment & Plan Note (Signed)
Heart rate is 76 today. Check hemoglobin and TSH. Repeat echocardiogram to reassess LV function.

## 2012-01-11 ENCOUNTER — Telehealth: Payer: Self-pay | Admitting: Cardiology

## 2012-01-11 NOTE — Telephone Encounter (Signed)
Spoke with pt, aware of lab results. 

## 2012-01-11 NOTE — Telephone Encounter (Signed)
Fu call °Patient returning your call °

## 2012-01-16 ENCOUNTER — Other Ambulatory Visit: Payer: Self-pay

## 2012-01-16 ENCOUNTER — Ambulatory Visit (HOSPITAL_COMMUNITY): Payer: Medicare PPO | Attending: Cardiology | Admitting: Radiology

## 2012-01-16 DIAGNOSIS — I10 Essential (primary) hypertension: Secondary | ICD-10-CM | POA: Insufficient documentation

## 2012-01-16 DIAGNOSIS — R5381 Other malaise: Secondary | ICD-10-CM | POA: Insufficient documentation

## 2012-01-16 DIAGNOSIS — I498 Other specified cardiac arrhythmias: Secondary | ICD-10-CM | POA: Insufficient documentation

## 2012-01-16 DIAGNOSIS — R0609 Other forms of dyspnea: Secondary | ICD-10-CM | POA: Insufficient documentation

## 2012-01-16 DIAGNOSIS — F1010 Alcohol abuse, uncomplicated: Secondary | ICD-10-CM | POA: Insufficient documentation

## 2012-01-16 DIAGNOSIS — I428 Other cardiomyopathies: Secondary | ICD-10-CM | POA: Insufficient documentation

## 2012-01-16 DIAGNOSIS — R0989 Other specified symptoms and signs involving the circulatory and respiratory systems: Secondary | ICD-10-CM | POA: Insufficient documentation

## 2012-02-08 ENCOUNTER — Encounter: Payer: Medicare PPO | Admitting: *Deleted

## 2012-04-03 ENCOUNTER — Other Ambulatory Visit: Payer: Self-pay | Admitting: Cardiology

## 2012-04-03 MED ORDER — SPIRONOLACTONE 25 MG PO TABS: 25.0000 mg | ORAL_TABLET | Freq: Every day | ORAL | Status: DC

## 2012-04-11 ENCOUNTER — Encounter: Payer: Self-pay | Admitting: Internal Medicine

## 2012-04-11 ENCOUNTER — Ambulatory Visit (INDEPENDENT_AMBULATORY_CARE_PROVIDER_SITE_OTHER): Payer: Medicare PPO | Admitting: *Deleted

## 2012-04-11 DIAGNOSIS — I428 Other cardiomyopathies: Secondary | ICD-10-CM

## 2012-04-11 LAB — ICD DEVICE OBSERVATION
BATTERY VOLTAGE: 2.79 V
DEV-0020ICD: NEGATIVE
FVT: 0
PACEART VT: 0
TZAT-0001SLOWVT: 1
TZAT-0001SLOWVT: 5
TZAT-0001SLOWVT: 6
TZAT-0002SLOWVT: NEGATIVE
TZAT-0002SLOWVT: NEGATIVE
TZAT-0002SLOWVT: NEGATIVE
TZAT-0004FASTVT: 8
TZAT-0005FASTVT: 88 pct
TZAT-0011FASTVT: 10 ms
TZAT-0012FASTVT: 200 ms
TZAT-0012SLOWVT: 200 ms
TZAT-0013FASTVT: 1
TZAT-0018SLOWVT: NEGATIVE
TZAT-0018SLOWVT: NEGATIVE
TZAT-0019SLOWVT: 8 V
TZAT-0019SLOWVT: 8 V
TZAT-0019SLOWVT: 8 V
TZAT-0020SLOWVT: 1.6 ms
TZAT-0020SLOWVT: 1.6 ms
TZON-0003FASTVT: 250 ms
TZON-0004SLOWVT: 16
TZON-0010AFLUTTER: 30 ms
TZON-0010FASTVT: 30 ms
TZON-0010SLOWVT: 30 ms
TZST-0001FASTVT: 3
TZST-0001FASTVT: 6
TZST-0003FASTVT: 25 J
TZST-0003FASTVT: 35 J
TZST-0003FASTVT: 35 J
VENTRICULAR PACING ICD: 0 pct

## 2012-04-11 NOTE — Progress Notes (Signed)
ICD check 

## 2012-05-18 ENCOUNTER — Other Ambulatory Visit (HOSPITAL_COMMUNITY): Payer: Self-pay

## 2012-05-18 MED ORDER — RAMIPRIL 10 MG PO CAPS: 10.0000 mg | ORAL_CAPSULE | Freq: Two times a day (BID) | ORAL | Status: DC

## 2012-05-18 NOTE — Telephone Encounter (Signed)
..   Requested Prescriptions   Signed Prescriptions Disp Refills  . ramipril (ALTACE) 10 MG capsule 180 capsule 2    Sig: Take 1 capsule (10 mg total) by mouth 2 times daily at 12 noon and 4 pm.    Authorizing Provider: Lewayne Bunting    Ordering User: Christella Hartigan, Ardyth Kelso Judie Petit

## 2012-07-09 ENCOUNTER — Encounter: Payer: Self-pay | Admitting: *Deleted

## 2012-07-16 ENCOUNTER — Ambulatory Visit (INDEPENDENT_AMBULATORY_CARE_PROVIDER_SITE_OTHER): Payer: Medicare PPO | Admitting: *Deleted

## 2012-07-16 ENCOUNTER — Encounter: Payer: Self-pay | Admitting: Internal Medicine

## 2012-07-16 DIAGNOSIS — I428 Other cardiomyopathies: Secondary | ICD-10-CM

## 2012-07-16 DIAGNOSIS — I509 Heart failure, unspecified: Secondary | ICD-10-CM

## 2012-07-16 LAB — ICD DEVICE OBSERVATION
BATTERY VOLTAGE: 2.72 V
FVT: 0
PACEART VT: 0
RV LEAD IMPEDENCE ICD: 560 Ohm
TZAT-0001SLOWVT: 1
TZAT-0001SLOWVT: 2
TZAT-0001SLOWVT: 3
TZAT-0001SLOWVT: 4
TZAT-0002SLOWVT: NEGATIVE
TZAT-0002SLOWVT: NEGATIVE
TZAT-0002SLOWVT: NEGATIVE
TZAT-0002SLOWVT: NEGATIVE
TZAT-0004FASTVT: 8
TZAT-0012FASTVT: 200 ms
TZAT-0012SLOWVT: 200 ms
TZAT-0012SLOWVT: 200 ms
TZAT-0012SLOWVT: 200 ms
TZAT-0012SLOWVT: 200 ms
TZAT-0018SLOWVT: NEGATIVE
TZAT-0018SLOWVT: NEGATIVE
TZAT-0018SLOWVT: NEGATIVE
TZAT-0019FASTVT: 8 V
TZAT-0019SLOWVT: 8 V
TZAT-0019SLOWVT: 8 V
TZAT-0019SLOWVT: 8 V
TZAT-0020FASTVT: 1.6 ms
TZAT-0020SLOWVT: 1.6 ms
TZAT-0020SLOWVT: 1.6 ms
TZAT-0020SLOWVT: 1.6 ms
TZON-0003FASTVT: 250 ms
TZON-0005SLOWVT: 12
TZON-0010AFLUTTER: 30 ms
TZON-0010FASTVT: 30 ms
TZON-0011AFLUTTER: 70
TZST-0001FASTVT: 2
TZST-0001FASTVT: 4
TZST-0001FASTVT: 6
TZST-0003FASTVT: 25 J
TZST-0003FASTVT: 35 J
TZST-0003FASTVT: 35 J

## 2012-07-16 NOTE — Progress Notes (Signed)
ICD check 

## 2012-11-13 ENCOUNTER — Encounter: Payer: Self-pay | Admitting: Internal Medicine

## 2012-11-13 ENCOUNTER — Ambulatory Visit (INDEPENDENT_AMBULATORY_CARE_PROVIDER_SITE_OTHER): Payer: Medicare PPO | Admitting: Internal Medicine

## 2012-11-13 VITALS — BP 144/86 | HR 119 | Resp 18 | Ht 68.0 in | Wt 158.0 lb

## 2012-11-13 DIAGNOSIS — Z95810 Presence of automatic (implantable) cardiac defibrillator: Secondary | ICD-10-CM

## 2012-11-13 DIAGNOSIS — I509 Heart failure, unspecified: Secondary | ICD-10-CM

## 2012-11-13 DIAGNOSIS — I498 Other specified cardiac arrhythmias: Secondary | ICD-10-CM

## 2012-11-13 DIAGNOSIS — R001 Bradycardia, unspecified: Secondary | ICD-10-CM

## 2012-11-13 DIAGNOSIS — I428 Other cardiomyopathies: Secondary | ICD-10-CM

## 2012-11-13 LAB — ICD DEVICE OBSERVATION
BATTERY VOLTAGE: 2.68 V
BRDY-0002RV: 40 {beats}/min
CHARGE TIME: 9.97 s
FVT: 0
PACEART VT: 0
TZAT-0001FASTVT: 1
TZAT-0001SLOWVT: 3
TZAT-0001SLOWVT: 4
TZAT-0001SLOWVT: 5
TZAT-0002SLOWVT: NEGATIVE
TZAT-0002SLOWVT: NEGATIVE
TZAT-0004FASTVT: 8
TZAT-0005FASTVT: 88 pct
TZAT-0011FASTVT: 10 ms
TZAT-0012SLOWVT: 200 ms
TZAT-0012SLOWVT: 200 ms
TZAT-0012SLOWVT: 200 ms
TZAT-0013FASTVT: 1
TZAT-0018SLOWVT: NEGATIVE
TZAT-0018SLOWVT: NEGATIVE
TZAT-0018SLOWVT: NEGATIVE
TZAT-0018SLOWVT: NEGATIVE
TZAT-0019SLOWVT: 8 V
TZAT-0019SLOWVT: 8 V
TZAT-0019SLOWVT: 8 V
TZAT-0020SLOWVT: 1.6 ms
TZAT-0020SLOWVT: 1.6 ms
TZON-0003SLOWVT: 330 ms
TZST-0001FASTVT: 2
TZST-0001FASTVT: 5
TZST-0003FASTVT: 35 J
TZST-0003FASTVT: 35 J
VENTRICULAR PACING ICD: 0 pct
VF: 0

## 2012-11-13 MED ORDER — ASPIRIN EC 81 MG PO TBEC: 81.0000 mg | DELAYED_RELEASE_TABLET | Freq: Every day | ORAL | Status: DC

## 2012-11-13 NOTE — Patient Instructions (Signed)
Change aspirin to 81 mg daily.

## 2012-11-13 NOTE — Assessment & Plan Note (Signed)
Notably this is not present currently. His heart rate averaged over the last 8 months though has been modestly higher than it had been at his visit last year his digoxin was discontinued and his beta blocker dose was from 150-100 which is the likely explanation.  TSH year ago was normal .

## 2012-11-13 NOTE — Assessment & Plan Note (Addendum)
This.aa As above  We will decrease his aspirin from 325-81

## 2012-11-13 NOTE — Progress Notes (Signed)
  HPI  Robert Massey is a 51 y.o. male Present Illness:  Robert Massey is seen in follow up nonischemic cardiomyopathy congestive heart failure with LBBB for which he implanted, for primary prevention, an ICD. Prior cardiac catheterization in October of 2003 revealed an ejection fraction of 10% and normal coronary arteries. . His most recent echocardiogram was performed in January of 2010. Ejection fraction was 25-30%. He continues with mild dyspnea on exertion. there has been no edema and no chest pain.   He also is modest fatigue. He was previously on carvedilol; it was thought he is not sure why. He thinks may be related to liver function test abnormalities are also potentially attributable to alcohol which he hasn't stay in the last year and a half.    He has 6949 lead in place    .   Past Medical History  Diagnosis Date  . Hypertension   . Systolic CHF, chronic   . LBBB (left bundle branch block)   . dilated cardiomyopathy   . AICD (automatic cardioverter/defibrillator) present     Medtronic Maximo 7232  . ALCOHOL ABUSE 06/18/2009  . FATTY LIVER DISEASE 02/14/2008  . 6949 lead   . Sinus bradycardia     Past Surgical History  Procedure Date  . Cardiac catheterization 09/12/2002  . Cardiac defibrillator placement     Medtronic Maximo 7232    Current Outpatient Prescriptions  Medication Sig Dispense Refill  . amLODipine (NORVASC) 5 MG tablet Take 5 mg by mouth daily.        Marland Kitchen aspirin 325 MG tablet Take 325 mg by mouth daily.        . Flax OIL Take by mouth daily.        . metoprolol (LOPRESSOR) 100 MG tablet Take one tablet by mouth twice daily.      . ramipril (ALTACE) 10 MG capsule Take 1 capsule (10 mg total) by mouth 2 times daily at 12 noon and 4 pm.  180 capsule  2  . spironolactone (ALDACTONE) 25 MG tablet Take 1 tablet (25 mg total) by mouth daily.  90 tablet  2    Allergies  Allergen Reactions  . Penicillins     Review of Systems negative except from  HPI and PMH  Physical Exam Well developed and well nourished in no acute distress HENT normal E scleral and icterus clear Neck Supple JVP flat; carotids brisk and full Clear to ausculation Regular rate and rhythm, 2/6 murmur at the left lower sternal border Soft with active bowel sounds No clubbing cyanosis none Edema Alert and oriented, grossly normal motor and sensory function Skin Warm and Dry  Electrocardiogram demonstrates sinus rhythm at 95 Intervals 17/15/39 Active he Assessment and  Plan  Electrocardiogram demonstrates sinus rhythm with left bundle branch block

## 2012-11-13 NOTE — Assessment & Plan Note (Signed)
The patient's device was interrogated.  The information was reviewed. No changes were made in the programming.    

## 2012-11-13 NOTE — Assessment & Plan Note (Signed)
He is euvolemic. He has significant fatigue. All this is potentially related to his left ventricular function I wonder also whether it might be related to his beta blockers this. Furthermore, there are data are better her cardiomyopathy outcomes of patients with carvedilol, metoprolol succinate, bisoprolol  and they may also contribute less to his fatigue.  He also has left bundle branch block. As he is approaching ERI in his device, it may be worth asking the question as to whether his functional limitations are sufficient to justify proceeding with CRT upgrade either at the time of device generator replacement or in anticipation of that

## 2012-12-05 HISTORY — PX: IMPLANTABLE CARDIOVERTER DEFIBRILLATOR IMPLANT: SHX5860

## 2013-01-07 ENCOUNTER — Encounter: Payer: Self-pay | Admitting: Cardiology

## 2013-01-07 ENCOUNTER — Ambulatory Visit (INDEPENDENT_AMBULATORY_CARE_PROVIDER_SITE_OTHER): Payer: Medicare PPO | Admitting: Cardiology

## 2013-01-07 VITALS — BP 124/82 | HR 70 | Ht 68.0 in | Wt 159.0 lb

## 2013-01-07 DIAGNOSIS — I428 Other cardiomyopathies: Secondary | ICD-10-CM

## 2013-01-07 LAB — BASIC METABOLIC PANEL
BUN: 13 mg/dL (ref 6–23)
CO2: 27 mEq/L (ref 19–32)
Chloride: 101 mEq/L (ref 96–112)
Creatinine, Ser: 0.9 mg/dL (ref 0.4–1.5)
Glucose, Bld: 105 mg/dL — ABNORMAL HIGH (ref 70–99)

## 2013-01-07 MED ORDER — SPIRONOLACTONE 25 MG PO TABS: 25.0000 mg | ORAL_TABLET | Freq: Every day | ORAL | Status: DC

## 2013-01-07 MED ORDER — RAMIPRIL 10 MG PO CAPS: 10.0000 mg | ORAL_CAPSULE | Freq: Two times a day (BID) | ORAL | Status: DC

## 2013-01-07 MED ORDER — CARVEDILOL 12.5 MG PO TABS: 12.5000 mg | ORAL_TABLET | Freq: Two times a day (BID) | ORAL | Status: DC

## 2013-01-07 NOTE — Assessment & Plan Note (Signed)
Followed by electrophysiology. When his generator needs changing consider upgrade to CRT.

## 2013-01-07 NOTE — Assessment & Plan Note (Signed)
Euvolemic on examination. Continue present dose of diuretic. 

## 2013-01-07 NOTE — Patient Instructions (Addendum)
Your physician wants you to follow-up in: ONE YEAR WITH DR Shelda Pal will receive a reminder letter in the mail two months in advance. If you don't receive a letter, please call our office to schedule the follow-up appointment.   STOP METOPROLOL  START CARVEDILOL 12.5 MG ONE TABLET TWICE DAILY  Your physician recommends that you HAVE LAB WORK TODAY

## 2013-01-07 NOTE — Assessment & Plan Note (Signed)
Blood pressure controlled. Continue present medications. Check potassium and renal function. 

## 2013-01-07 NOTE — Progress Notes (Signed)
   HPI: [name] is a pleasant gentleman who has a history of nonischemic cardiomyopathy and he has had a prior ICD. Prior cardiac catheterization in October of 2003 revealed an ejection fraction of 10% and normal coronary arteries. His most recent echocardiogram was performed in February 2013. Ejection fraction was 30% and there is diffuse hypokinesis. There was grade 2 diastolic dysfunction. There was mild left atrial enlargement and trace mitral regurgitation. Since he was last seen, the patient has dyspnea with more extreme activities but not with routine activities. It is relieved with rest. It is not associated with chest pain. There is no orthopnea, PND or pedal edema. There is no syncope or palpitations. There is no exertional chest pain.    Current Outpatient Prescriptions  Medication Sig Dispense Refill  . aspirin EC 81 MG tablet Take 1 tablet (81 mg total) by mouth daily.  30 tablet  11  . Flax OIL Take by mouth daily.        . metoprolol (LOPRESSOR) 100 MG tablet Take one tablet by mouth twice daily.      . ramipril (ALTACE) 10 MG capsule Take 1 capsule (10 mg total) by mouth 2 times daily at 12 noon and 4 pm.  180 capsule  2  . spironolactone (ALDACTONE) 25 MG tablet Take 1 tablet (25 mg total) by mouth daily.  90 tablet  2     Past Medical History  Diagnosis Date  . Hypertension   . Systolic CHF, chronic   . LBBB (left bundle branch block)   . dilated cardiomyopathy   . AICD (automatic cardioverter/defibrillator) present     Medtronic Maximo [id number]  . ALCOHOL ABUSE 06/18/2009  . FATTY LIVER DISEASE 02/14/2008  . 6949 lead   . Sinus bradycardia     Past Surgical History  Procedure Date  . Cardiac catheterization 09/12/2002  . Cardiac defibrillator placement     Medtronic Maximo [id number]    History   Social History  . Marital Status: Married    Spouse Name: N/A    Number of Children: N/A  . Years of Education: N/A   Occupational History  . Not on file.   Social  History Main Topics  . Smoking status: Never Smoker   . Smokeless tobacco: Never Used  . Alcohol Use: Yes  . Drug Use: Not on file  . Sexually Active: Not on file   Other Topics Concern  . Not on file   Social History Narrative  . No narrative on file    ROS: fatigue but no fevers or chills, productive cough, hemoptysis, dysphasia, odynophagia, melena, hematochezia, dysuria, hematuria, rash, seizure activity, orthopnea, PND, pedal edema, claudication. Remaining systems are negative.  Physical Exam: Well-developed well-nourished in no acute distress.  Skin is warm and dry.  HEENT is normal.  Neck is supple.  Chest is clear to auscultation with normal expansion.  Cardiovascular exam is regular rate and rhythm.  Abdominal exam nontender or distended. No masses palpated. Extremities show no edema. neuro grossly intact  ECG 11/13/2012-sinus rhythm with left bundle branch block.

## 2013-01-07 NOTE — Assessment & Plan Note (Signed)
Continue ACE inhibitor. Discontinue metoprolol. Begin carvedilol 12.5 mg by mouth twice a day and increase as needed/tolerated.

## 2013-01-19 ENCOUNTER — Other Ambulatory Visit: Payer: Self-pay

## 2013-02-05 ENCOUNTER — Encounter: Payer: Self-pay | Admitting: Cardiology

## 2013-02-05 ENCOUNTER — Ambulatory Visit (INDEPENDENT_AMBULATORY_CARE_PROVIDER_SITE_OTHER): Payer: Medicare PPO | Admitting: Cardiology

## 2013-02-05 ENCOUNTER — Encounter: Payer: Self-pay | Admitting: Internal Medicine

## 2013-02-05 DIAGNOSIS — Z95810 Presence of automatic (implantable) cardiac defibrillator: Secondary | ICD-10-CM

## 2013-02-05 DIAGNOSIS — I428 Other cardiomyopathies: Secondary | ICD-10-CM

## 2013-02-05 LAB — ICD DEVICE OBSERVATION
BATTERY VOLTAGE: 2.65 V
BRDY-0002RV: 40 {beats}/min
DEV-0020ICD: NEGATIVE
RV LEAD IMPEDENCE ICD: 528 Ohm
TZAT-0001FASTVT: 1
TZAT-0001SLOWVT: 2
TZAT-0001SLOWVT: 3
TZAT-0002SLOWVT: NEGATIVE
TZAT-0002SLOWVT: NEGATIVE
TZAT-0005FASTVT: 88 pct
TZAT-0012SLOWVT: 200 ms
TZAT-0012SLOWVT: 200 ms
TZAT-0012SLOWVT: 200 ms
TZAT-0013FASTVT: 1
TZAT-0018FASTVT: NEGATIVE
TZAT-0018SLOWVT: NEGATIVE
TZAT-0018SLOWVT: NEGATIVE
TZAT-0018SLOWVT: NEGATIVE
TZAT-0019SLOWVT: 8 V
TZAT-0019SLOWVT: 8 V
TZAT-0019SLOWVT: 8 V
TZAT-0020FASTVT: 1.6 ms
TZAT-0020SLOWVT: 1.6 ms
TZAT-0020SLOWVT: 1.6 ms
TZAT-0020SLOWVT: 1.6 ms
TZAT-0020SLOWVT: 1.6 ms
TZON-0004SLOWVT: 24
TZON-0005SLOWVT: 12
TZON-0010FASTVT: 30 ms
TZON-0010VSLOWVT: 30 ms
TZST-0001FASTVT: 2
TZST-0003FASTVT: 35 J
TZST-0003FASTVT: 35 J
VF: 0

## 2013-02-05 NOTE — Progress Notes (Signed)
ICD check/device clinic visit. See PaceArt report. 

## 2013-02-05 NOTE — Patient Instructions (Signed)
Your physician recommends that you schedule a follow-up appointment in: 3 months with Device Clinic     

## 2013-05-08 ENCOUNTER — Ambulatory Visit (INDEPENDENT_AMBULATORY_CARE_PROVIDER_SITE_OTHER): Payer: Medicare PPO | Admitting: *Deleted

## 2013-05-08 DIAGNOSIS — I498 Other specified cardiac arrhythmias: Secondary | ICD-10-CM

## 2013-05-08 DIAGNOSIS — R001 Bradycardia, unspecified: Secondary | ICD-10-CM

## 2013-05-08 DIAGNOSIS — I509 Heart failure, unspecified: Secondary | ICD-10-CM

## 2013-05-08 DIAGNOSIS — I428 Other cardiomyopathies: Secondary | ICD-10-CM

## 2013-05-08 LAB — ICD DEVICE OBSERVATION
BRDY-0002RV: 40 {beats}/min
CHARGE TIME: 10.41 s
DEV-0020ICD: NEGATIVE
PACEART VT: 0
RV LEAD AMPLITUDE: 7.7 mv
TZAT-0001FASTVT: 1
TZAT-0001SLOWVT: 2
TZAT-0001SLOWVT: 3
TZAT-0001SLOWVT: 4
TZAT-0001SLOWVT: 6
TZAT-0002SLOWVT: NEGATIVE
TZAT-0002SLOWVT: NEGATIVE
TZAT-0005FASTVT: 88 pct
TZAT-0012FASTVT: 200 ms
TZAT-0012SLOWVT: 200 ms
TZAT-0012SLOWVT: 200 ms
TZAT-0012SLOWVT: 200 ms
TZAT-0013FASTVT: 1
TZAT-0018FASTVT: NEGATIVE
TZAT-0018SLOWVT: NEGATIVE
TZAT-0018SLOWVT: NEGATIVE
TZAT-0018SLOWVT: NEGATIVE
TZAT-0018SLOWVT: NEGATIVE
TZAT-0019SLOWVT: 8 V
TZAT-0019SLOWVT: 8 V
TZAT-0019SLOWVT: 8 V
TZAT-0020FASTVT: 1.6 ms
TZAT-0020SLOWVT: 1.6 ms
TZAT-0020SLOWVT: 1.6 ms
TZAT-0020SLOWVT: 1.6 ms
TZAT-0020SLOWVT: 1.6 ms
TZON-0003FASTVT: 250 ms
TZON-0003SLOWVT: 330 ms
TZON-0004SLOWVT: 32
TZON-0005SLOWVT: 12
TZON-0008SLOWVT: 0 ms
TZON-0010FASTVT: 30 ms
TZON-0010SLOWVT: 30 ms
TZON-0010VSLOWVT: 30 ms
TZST-0001FASTVT: 2
TZST-0001FASTVT: 5
TZST-0003FASTVT: 35 J
TZST-0003FASTVT: 35 J
TZST-0003FASTVT: 35 J
VENTRICULAR PACING ICD: 0 pct

## 2013-05-08 NOTE — Progress Notes (Signed)
ICD check in office. 

## 2013-05-29 ENCOUNTER — Encounter: Payer: Self-pay | Admitting: Internal Medicine

## 2013-08-08 ENCOUNTER — Ambulatory Visit (INDEPENDENT_AMBULATORY_CARE_PROVIDER_SITE_OTHER): Payer: Medicare PPO | Admitting: *Deleted

## 2013-08-08 DIAGNOSIS — I428 Other cardiomyopathies: Secondary | ICD-10-CM

## 2013-08-08 LAB — ICD DEVICE OBSERVATION
BRDY-0002RV: 40 {beats}/min
CHARGE TIME: 11.58 s
DEV-0020ICD: NEGATIVE
RV LEAD AMPLITUDE: 8.1 mv
RV LEAD IMPEDENCE ICD: 592 Ohm
RV LEAD THRESHOLD: 1 V
TZAT-0001FASTVT: 1
TZAT-0001SLOWVT: 1
TZAT-0001SLOWVT: 2
TZAT-0001SLOWVT: 5
TZAT-0001SLOWVT: 6
TZAT-0002SLOWVT: NEGATIVE
TZAT-0002SLOWVT: NEGATIVE
TZAT-0002SLOWVT: NEGATIVE
TZAT-0011FASTVT: 10 ms
TZAT-0012SLOWVT: 200 ms
TZAT-0018FASTVT: NEGATIVE
TZAT-0018SLOWVT: NEGATIVE
TZAT-0018SLOWVT: NEGATIVE
TZAT-0018SLOWVT: NEGATIVE
TZAT-0019FASTVT: 8 V
TZAT-0019SLOWVT: 8 V
TZAT-0019SLOWVT: 8 V
TZAT-0020SLOWVT: 1.6 ms
TZAT-0020SLOWVT: 1.6 ms
TZON-0004SLOWVT: 32
TZON-0005SLOWVT: 12
TZON-0008FASTVT: 0 ms
TZON-0008SLOWVT: 0 ms
TZON-0010AFLUTTER: 30 ms
TZON-0010FASTVT: 30 ms
TZON-0010SLOWVT: 30 ms
TZST-0001FASTVT: 3
TZST-0001FASTVT: 6
TZST-0003FASTVT: 35 J
TZST-0003FASTVT: 35 J

## 2013-08-08 NOTE — Progress Notes (Signed)
MDT ICD check in clinic. All functions normal, no changes made, full details in paceart.  ROV w/ Dr. Graciela Husbands 11/07/13 @ 4:00

## 2013-08-21 ENCOUNTER — Telehealth: Payer: Self-pay | Admitting: Internal Medicine

## 2013-08-21 ENCOUNTER — Ambulatory Visit (INDEPENDENT_AMBULATORY_CARE_PROVIDER_SITE_OTHER): Payer: Medicare PPO | Admitting: *Deleted

## 2013-08-21 DIAGNOSIS — Z4502 Encounter for adjustment and management of automatic implantable cardiac defibrillator: Secondary | ICD-10-CM

## 2013-08-21 NOTE — Progress Notes (Signed)
Pt's device beeping for past three days.  He did reach ERI of 2.62V but his device presents today at 2.64V. Programmer shows device was at 2.62V but jumped back to 2.64V.  Pt understands to contact us if device beeps again prior to his ROV w/ Dr. Graciela Husbands 11/07/13. Pt also carries magnet with him at all times due to 6949 lead if beeping is not related to ERI.

## 2013-08-21 NOTE — Telephone Encounter (Signed)
Made appt for pt to come today at 4:30 to decipher cause of beeping.

## 2013-08-21 NOTE — Telephone Encounter (Signed)
New problem    C/o defib starting beeping.

## 2013-08-21 NOTE — Telephone Encounter (Signed)
Noticed 3 days ago beeping sound from ICD/ medtronic. Request call back from device clinic. He can be reached at either numbers.

## 2013-08-22 LAB — ICD DEVICE OBSERVATION
TZAT-0001SLOWVT: 1
TZAT-0001SLOWVT: 2
TZAT-0001SLOWVT: 3
TZAT-0001SLOWVT: 4
TZAT-0002SLOWVT: NEGATIVE
TZAT-0002SLOWVT: NEGATIVE
TZAT-0002SLOWVT: NEGATIVE
TZAT-0002SLOWVT: NEGATIVE
TZAT-0004FASTVT: 8
TZAT-0005FASTVT: 88 pct
TZAT-0011FASTVT: 10 ms
TZAT-0012FASTVT: 200 ms
TZAT-0012SLOWVT: 200 ms
TZAT-0012SLOWVT: 200 ms
TZAT-0012SLOWVT: 200 ms
TZAT-0012SLOWVT: 200 ms
TZAT-0018SLOWVT: NEGATIVE
TZAT-0018SLOWVT: NEGATIVE
TZAT-0018SLOWVT: NEGATIVE
TZAT-0019SLOWVT: 8 V
TZAT-0019SLOWVT: 8 V
TZAT-0019SLOWVT: 8 V
TZAT-0019SLOWVT: 8 V
TZAT-0020SLOWVT: 1.6 ms
TZAT-0020SLOWVT: 1.6 ms
TZAT-0020SLOWVT: 1.6 ms
TZAT-0020SLOWVT: 1.6 ms
TZON-0003FASTVT: 250 ms
TZON-0004SLOWVT: 32
TZON-0005SLOWVT: 12
TZON-0008FASTVT: 0 ms
TZON-0010AFLUTTER: 30 ms
TZST-0001FASTVT: 3
TZST-0001FASTVT: 6
TZST-0003FASTVT: 25 J
TZST-0003FASTVT: 35 J
TZST-0003FASTVT: 35 J

## 2013-08-26 ENCOUNTER — Telehealth: Payer: Self-pay | Admitting: Internal Medicine

## 2013-08-26 NOTE — Telephone Encounter (Signed)
New Problem  Request a call back from device clinic/// states his device alarm went off// battery is getting low// no shocking.

## 2013-08-26 NOTE — Telephone Encounter (Signed)
Per pt device alerted x last two mornings. Last check, battery voltage 2.64 V. Pt rescheduled for 09-10-13 @ 1600 with SK. Pt was offered to come by office and alert turned off but declined at this time. Pt aware of appointment.

## 2013-08-27 ENCOUNTER — Encounter: Payer: Self-pay | Admitting: Internal Medicine

## 2013-09-10 ENCOUNTER — Encounter: Payer: Self-pay | Admitting: Internal Medicine

## 2013-09-10 ENCOUNTER — Ambulatory Visit (INDEPENDENT_AMBULATORY_CARE_PROVIDER_SITE_OTHER): Payer: Medicare PPO | Admitting: Internal Medicine

## 2013-09-10 VITALS — BP 124/78 | HR 74 | Ht 67.0 in | Wt 157.0 lb

## 2013-09-10 DIAGNOSIS — I498 Other specified cardiac arrhythmias: Secondary | ICD-10-CM

## 2013-09-10 DIAGNOSIS — Z95810 Presence of automatic (implantable) cardiac defibrillator: Secondary | ICD-10-CM

## 2013-09-10 DIAGNOSIS — R001 Bradycardia, unspecified: Secondary | ICD-10-CM

## 2013-09-10 DIAGNOSIS — T82198A Other mechanical complication of other cardiac electronic device, initial encounter: Secondary | ICD-10-CM

## 2013-09-10 DIAGNOSIS — I428 Other cardiomyopathies: Secondary | ICD-10-CM

## 2013-09-10 DIAGNOSIS — I447 Left bundle-branch block, unspecified: Secondary | ICD-10-CM

## 2013-09-10 DIAGNOSIS — I509 Heart failure, unspecified: Secondary | ICD-10-CM

## 2013-09-10 LAB — ICD DEVICE OBSERVATION
DEV-0020ICD: NEGATIVE
RV LEAD AMPLITUDE: 7.2 mv
TZAT-0001SLOWVT: 1
TZAT-0001SLOWVT: 2
TZAT-0001SLOWVT: 6
TZAT-0002SLOWVT: NEGATIVE
TZAT-0002SLOWVT: NEGATIVE
TZAT-0011FASTVT: 10 ms
TZAT-0012FASTVT: 200 ms
TZAT-0012SLOWVT: 200 ms
TZAT-0012SLOWVT: 200 ms
TZAT-0018FASTVT: NEGATIVE
TZAT-0018SLOWVT: NEGATIVE
TZAT-0018SLOWVT: NEGATIVE
TZAT-0019FASTVT: 8 V
TZAT-0019SLOWVT: 8 V
TZAT-0019SLOWVT: 8 V
TZAT-0019SLOWVT: 8 V
TZAT-0020SLOWVT: 1.6 ms
TZAT-0020SLOWVT: 1.6 ms
TZAT-0020SLOWVT: 1.6 ms
TZON-0003FASTVT: 250 ms
TZON-0004SLOWVT: 32
TZON-0005SLOWVT: 12
TZON-0008FASTVT: 0 ms
TZON-0010AFLUTTER: 30 ms
TZON-0010FASTVT: 30 ms
TZON-0010SLOWVT: 30 ms
TZON-0010VSLOWVT: 30 ms
TZST-0001FASTVT: 3
TZST-0001FASTVT: 6
TZST-0003FASTVT: 35 J
VENTRICULAR PACING ICD: 0 pct
VF: 0

## 2013-09-10 NOTE — Progress Notes (Signed)
  HPI  Robert Massey is a 52 y.o. male Present Illness:  Robert Massey is seen in follow up nonischemic cardiomyopathy congestive heart failure with LBBB for which he implanted, for primary prevention, an ICD. Prior cardiac catheterization in October of 2003 revealed an ejection fraction of 10% and normal coronary arteries. . His most recent echocardiogram was performed in Feb 2013 at which time EF 30%  He continues with mild dyspnea on exertion. there has been no edema and no chest pain.   He also is modest fatigue. He was previously on carvedilol; it was thought he is not sure why. He thinks may be related to liver function test abnormalities are also potentially attributable to alcohol which he hasn't stay in the last year and a half.    He has 6949 lead in place    .   Past Medical History  Diagnosis Date  . Hypertension   . Systolic CHF, chronic   . LBBB (left bundle branch block)   . dilated cardiomyopathy   . AICD (automatic cardioverter/defibrillator) present     Medtronic Maximo [id number]  . ALCOHOL ABUSE 06/18/2009  . FATTY LIVER DISEASE 02/14/2008  . 6949 lead   . Sinus bradycardia     Past Surgical History  Procedure Laterality Date  . Cardiac catheterization  09/12/2002  . Cardiac defibrillator placement      Medtronic Maximo [id number]    Current Outpatient Prescriptions  Medication Sig Dispense Refill  . aspirin EC 81 MG tablet Take 1 tablet (81 mg total) by mouth daily.  30 tablet  11  . carvedilol (COREG) 12.5 MG tablet Take 1 tablet (12.5 mg total) by mouth 2 (two) times daily.  180 tablet  3  . Flax OIL Take by mouth daily.        . ramipril (ALTACE) 10 MG capsule Take 1 capsule (10 mg total) by mouth 2 times daily at 12 noon and 4 pm.  180 capsule  3  . spironolactone (ALDACTONE) 25 MG tablet Take 1 tablet (25 mg total) by mouth daily.  90 tablet  3   No current facility-administered medications for this visit.    Allergies  Allergen Reactions  .  Penicillins     Review of Systems negative except from HPI and PMH  Physical Exam Well developed and well nourished in no acute distress HENT normal E scleral and icterus clear Neck Supple JVP flat; carotids brisk and full Clear to ausculation Regular rate and rhythm, 2/6 murmur at the left lower sternal border Soft with active bowel sounds No clubbing cyanosis none Edema Alert and oriented, grossly normal motor and sensory function Skin Warm and Dry  Electrocardiogram demonstrates sinus rhythm at 95 Intervals 17/15/39 Active he Assessment and  Plan  Electrocardiogram demonstrates sinus rhythm with left bundle branch block

## 2013-09-10 NOTE — Assessment & Plan Note (Signed)
As above.

## 2013-09-10 NOTE — Assessment & Plan Note (Signed)
Back of atrial

## 2013-09-10 NOTE — Assessment & Plan Note (Signed)
The patient has long-standing nonischemic cardiomyopathy. He has class IIb-3 congestive heart failure. He is euvolemic. His device has reached ERI. He is a 6949 we placed.  He will be device generator replacement and revision of his [phone]. There is no evidence on the surface of collateral veins  so we may well be able to tolerate 3 leads including atrial lead and a left ventricular lead as he appropriately considered for resynchronization class 2a indictation with left bundle branch block   In the event that there is not a great deal of venous space  we could potentially reversed the rate sense portion of his old defibrillator lead and the newly implanted LV lead  We have reviewed the benefits and risks of generator replacement.  These include but are not limited to lead fracture and infection.  The patient understands, agrees and is willing to proceed.

## 2013-09-10 NOTE — Patient Instructions (Addendum)
Your physician recommends that you return for lab work in: to be determined within two weeks prior to surgery  Your physician recommends that you have a device upgrade and lead revision in November (Nov 19)  Your physician has requested that you have an echocardiogram. Echocardiography is a painless test that uses sound waves to create images of your heart. It provides your doctor with information about the size and shape of your heart and how well your heart's chambers and valves are working. This procedure takes approximately one hour. There are no restrictions for this procedure.  Your physician recommends that you continue on your current medications as directed. Please refer to the Current Medication list given to you today.

## 2013-09-12 ENCOUNTER — Encounter: Payer: Self-pay | Admitting: *Deleted

## 2013-09-12 ENCOUNTER — Other Ambulatory Visit: Payer: Self-pay | Admitting: *Deleted

## 2013-09-12 ENCOUNTER — Encounter: Payer: Self-pay | Admitting: Internal Medicine

## 2013-09-12 DIAGNOSIS — I428 Other cardiomyopathies: Secondary | ICD-10-CM

## 2013-09-25 ENCOUNTER — Encounter: Payer: Self-pay | Admitting: Internal Medicine

## 2013-10-10 ENCOUNTER — Other Ambulatory Visit (INDEPENDENT_AMBULATORY_CARE_PROVIDER_SITE_OTHER): Payer: Medicare PPO

## 2013-10-10 ENCOUNTER — Ambulatory Visit (HOSPITAL_COMMUNITY): Payer: Medicare PPO | Attending: Cardiology | Admitting: Radiology

## 2013-10-10 ENCOUNTER — Other Ambulatory Visit: Payer: Self-pay

## 2013-10-10 DIAGNOSIS — I428 Other cardiomyopathies: Secondary | ICD-10-CM

## 2013-10-10 DIAGNOSIS — Z95810 Presence of automatic (implantable) cardiac defibrillator: Secondary | ICD-10-CM

## 2013-10-10 DIAGNOSIS — I509 Heart failure, unspecified: Secondary | ICD-10-CM | POA: Insufficient documentation

## 2013-10-10 DIAGNOSIS — F1010 Alcohol abuse, uncomplicated: Secondary | ICD-10-CM | POA: Insufficient documentation

## 2013-10-10 DIAGNOSIS — I447 Left bundle-branch block, unspecified: Secondary | ICD-10-CM | POA: Insufficient documentation

## 2013-10-10 DIAGNOSIS — I5022 Chronic systolic (congestive) heart failure: Secondary | ICD-10-CM | POA: Insufficient documentation

## 2013-10-10 DIAGNOSIS — K7689 Other specified diseases of liver: Secondary | ICD-10-CM | POA: Insufficient documentation

## 2013-10-10 DIAGNOSIS — R5381 Other malaise: Secondary | ICD-10-CM | POA: Insufficient documentation

## 2013-10-10 LAB — CBC WITH DIFFERENTIAL/PLATELET
Basophils Relative: 0.5 % (ref 0.0–3.0)
Eosinophils Absolute: 0.2 10*3/uL (ref 0.0–0.7)
Eosinophils Relative: 2.7 % (ref 0.0–5.0)
HCT: 46.2 % (ref 39.0–52.0)
Hemoglobin: 16 g/dL (ref 13.0–17.0)
Lymphs Abs: 2 10*3/uL (ref 0.7–4.0)
MCHC: 34.6 g/dL (ref 30.0–36.0)
MCV: 88.6 fl (ref 78.0–100.0)
Monocytes Absolute: 0.4 10*3/uL (ref 0.1–1.0)
Neutro Abs: 5.3 10*3/uL (ref 1.4–7.7)
Neutrophils Relative %: 65.8 % (ref 43.0–77.0)
Platelets: 247 10*3/uL (ref 150.0–400.0)
WBC: 8 10*3/uL (ref 4.5–10.5)

## 2013-10-10 LAB — BASIC METABOLIC PANEL
BUN: 13 mg/dL (ref 6–23)
CO2: 31 mEq/L (ref 19–32)
Chloride: 102 mEq/L (ref 96–112)
Creatinine, Ser: 1 mg/dL (ref 0.4–1.5)
Glucose, Bld: 109 mg/dL — ABNORMAL HIGH (ref 70–99)
Potassium: 4.8 mEq/L (ref 3.5–5.1)

## 2013-10-10 NOTE — Progress Notes (Signed)
Echocardiogram performed.  

## 2013-10-11 ENCOUNTER — Encounter (HOSPITAL_COMMUNITY): Payer: Self-pay | Admitting: Pharmacy Technician

## 2013-10-22 MED ORDER — VANCOMYCIN HCL IN DEXTROSE 1-5 GM/200ML-% IV SOLN
1000.0000 mg | INTRAVENOUS | Status: DC
  Filled 2013-10-22: qty 200

## 2013-10-22 MED ORDER — SODIUM CHLORIDE 0.9 % IR SOLN
80.0000 mg | Status: DC
  Filled 2013-10-22: qty 2

## 2013-10-23 ENCOUNTER — Ambulatory Visit (HOSPITAL_COMMUNITY)
Admission: RE | Admit: 2013-10-23 | Discharge: 2013-10-23 | Disposition: A | Discharge status: Home / Self Care | Payer: Medicare HMO | Source: Ambulatory Visit | Attending: Internal Medicine | Admitting: Internal Medicine

## 2013-10-23 ENCOUNTER — Ambulatory Visit (HOSPITAL_COMMUNITY): Payer: Medicare HMO

## 2013-10-23 ENCOUNTER — Encounter (HOSPITAL_COMMUNITY): Payer: Self-pay | Admitting: General Practice

## 2013-10-23 ENCOUNTER — Encounter (HOSPITAL_COMMUNITY)
Admission: RE | Disposition: A | Discharge status: Home / Self Care | Payer: Self-pay | Source: Ambulatory Visit | Attending: Internal Medicine

## 2013-10-23 DIAGNOSIS — I5022 Chronic systolic (congestive) heart failure: Secondary | ICD-10-CM | POA: Insufficient documentation

## 2013-10-23 DIAGNOSIS — F1010 Alcohol abuse, uncomplicated: Secondary | ICD-10-CM | POA: Insufficient documentation

## 2013-10-23 DIAGNOSIS — I447 Left bundle-branch block, unspecified: Secondary | ICD-10-CM | POA: Insufficient documentation

## 2013-10-23 DIAGNOSIS — Z23 Encounter for immunization: Secondary | ICD-10-CM | POA: Insufficient documentation

## 2013-10-23 DIAGNOSIS — R0989 Other specified symptoms and signs involving the circulatory and respiratory systems: Secondary | ICD-10-CM | POA: Insufficient documentation

## 2013-10-23 DIAGNOSIS — Z4502 Encounter for adjustment and management of automatic implantable cardiac defibrillator: Secondary | ICD-10-CM | POA: Insufficient documentation

## 2013-10-23 DIAGNOSIS — Z95810 Presence of automatic (implantable) cardiac defibrillator: Secondary | ICD-10-CM

## 2013-10-23 DIAGNOSIS — I10 Essential (primary) hypertension: Secondary | ICD-10-CM | POA: Insufficient documentation

## 2013-10-23 DIAGNOSIS — I428 Other cardiomyopathies: Secondary | ICD-10-CM | POA: Insufficient documentation

## 2013-10-23 DIAGNOSIS — I509 Heart failure, unspecified: Secondary | ICD-10-CM | POA: Insufficient documentation

## 2013-10-23 DIAGNOSIS — R0609 Other forms of dyspnea: Secondary | ICD-10-CM | POA: Insufficient documentation

## 2013-10-23 DIAGNOSIS — K7689 Other specified diseases of liver: Secondary | ICD-10-CM | POA: Insufficient documentation

## 2013-10-23 HISTORY — PX: BI-VENTRICULAR IMPLANTABLE CARDIOVERTER DEFIBRILLATOR: SHX5459

## 2013-10-23 LAB — SURGICAL PCR SCREEN
MRSA, PCR: NEGATIVE
Staphylococcus aureus: NEGATIVE

## 2013-10-23 SURGERY — BI-VENTRICULAR IMPLANTABLE CARDIOVERTER DEFIBRILLATOR  (CRT-D)
Anesthesia: LOCAL

## 2013-10-23 MED ORDER — MIDAZOLAM HCL 5 MG/5ML IJ SOLN
INTRAMUSCULAR | Status: AC
  Filled 2013-10-23: qty 5

## 2013-10-23 MED ORDER — INFLUENZA VAC SPLIT QUAD 0.5 ML IM SUSP
0.5000 mL | INTRAMUSCULAR | Status: AC
  Administered 2013-10-23: 0.5 mL via INTRAMUSCULAR
  Filled 2013-10-23: qty 0.5

## 2013-10-23 MED ORDER — SODIUM CHLORIDE 0.9 % IV SOLN: INTRAVENOUS | Status: AC

## 2013-10-23 MED ORDER — RAMIPRIL 10 MG PO CAPS
10.0000 mg | ORAL_CAPSULE | Freq: Two times a day (BID) | ORAL | Status: DC
Start: 2013-10-23 — End: 2013-10-23
  Administered 2013-10-23: 18:00:00 10 mg via ORAL
  Filled 2013-10-23 (×2): qty 1

## 2013-10-23 MED ORDER — SODIUM CHLORIDE 0.9 % IV SOLN
INTRAVENOUS | Status: DC
  Administered 2013-10-23: 07:00:00 via INTRAVENOUS

## 2013-10-23 MED ORDER — VANCOMYCIN HCL IN DEXTROSE 1-5 GM/200ML-% IV SOLN
1000.0000 mg | Freq: Two times a day (BID) | INTRAVENOUS | Status: DC
  Filled 2013-10-23: qty 200

## 2013-10-23 MED ORDER — MUPIROCIN 2 % EX OINT
TOPICAL_OINTMENT | Freq: Two times a day (BID) | CUTANEOUS | Status: DC
  Administered 2013-10-23: 07:00:00 via NASAL
  Filled 2013-10-23: qty 22

## 2013-10-23 MED ORDER — LIDOCAINE HCL (PF) 1 % IJ SOLN
INTRAMUSCULAR | Status: AC
  Filled 2013-10-23: qty 60

## 2013-10-23 MED ORDER — MUPIROCIN 2 % EX OINT
TOPICAL_OINTMENT | CUTANEOUS | Status: AC
  Filled 2013-10-23: qty 22

## 2013-10-23 MED ORDER — ONDANSETRON HCL 4 MG/2ML IJ SOLN: 4.0000 mg | Freq: Four times a day (QID) | INTRAMUSCULAR | Status: DC | PRN

## 2013-10-23 MED ORDER — FENTANYL CITRATE 0.05 MG/ML IJ SOLN
INTRAMUSCULAR | Status: AC
  Filled 2013-10-23: qty 2

## 2013-10-23 MED ORDER — SPIRONOLACTONE 25 MG PO TABS
25.0000 mg | ORAL_TABLET | Freq: Every day | ORAL | Status: DC
  Administered 2013-10-23: 25 mg via ORAL
  Filled 2013-10-23: qty 1

## 2013-10-23 MED ORDER — ASPIRIN EC 81 MG PO TBEC
81.0000 mg | DELAYED_RELEASE_TABLET | Freq: Every day | ORAL | Status: DC
  Filled 2013-10-23: qty 1

## 2013-10-23 MED ORDER — PNEUMOCOCCAL VAC POLYVALENT 25 MCG/0.5ML IJ INJ
0.5000 mL | INJECTION | INTRAMUSCULAR | Status: AC
  Administered 2013-10-23: 20:00:00 0.5 mL via INTRAMUSCULAR
  Filled 2013-10-23: qty 0.5

## 2013-10-23 MED ORDER — ACETAMINOPHEN 325 MG PO TABS
325.0000 mg | ORAL_TABLET | ORAL | Status: DC | PRN
  Administered 2013-10-23: 12:00:00 650 mg via ORAL
  Filled 2013-10-23 (×2): qty 2

## 2013-10-23 MED ORDER — VANCOMYCIN HCL IN DEXTROSE 1-5 GM/200ML-% IV SOLN
1000.0000 mg | Freq: Two times a day (BID) | INTRAVENOUS | Status: DC
  Administered 2013-10-23: 1000 mg via INTRAVENOUS
  Filled 2013-10-23: qty 200

## 2013-10-23 MED ORDER — CARVEDILOL 12.5 MG PO TABS: 12.5000 mg | ORAL_TABLET | Freq: Two times a day (BID) | ORAL | Status: DC

## 2013-10-23 MED ORDER — CHLORHEXIDINE GLUCONATE 4 % EX LIQD
60.0000 mL | Freq: Once | CUTANEOUS | Status: DC
  Filled 2013-10-23: qty 60

## 2013-10-23 NOTE — H&P (Signed)
Patient Care Team: [name], MD as PCP - General (Unknown Physician Specialty)   HPI  Robert Massey is a 52 y.o. male with nonischemic cardiomyopathy congestive heart failure with LBBB for which he implanted, for primary prevention, an ICD. He is here for device generator replacement and replacing of the [phone] ICD recall lead   Prior cardiac catheterization in October of 2003 revealed an ejection fraction of 10% and normal coronary arteries   His most recent echocardiogram was performed in 11/14  EF 40-45%   He continues with mild dyspnea on exertion. there has been no edema and no chest pain.   He was previously on carvedilol; it was thought he is not sure why. He thinks may be related to liver function test abnormalities are also potentially attributable to alcohol which he hasn't stay in the last year and a half.   Past Medical History  Diagnosis Date  . Hypertension   . Systolic CHF, chronic   . LBBB (left bundle branch block)   . dilated cardiomyopathy   . AICD (automatic cardioverter/defibrillator) present     Medtronic Maximo [id number]  . ALCOHOL ABUSE 06/18/2009  . FATTY LIVER DISEASE 02/14/2008  . 6949 lead   . Sinus bradycardia     Past Surgical History  Procedure Laterality Date  . Cardiac catheterization  09/12/2002  . Cardiac defibrillator placement      Medtronic Maximo [id number]    Current Facility-Administered Medications  Medication Dose Route Frequency Provider Last Rate Last Dose  . 0.9 %  sodium chloride infusion   Intravenous Continuous Duke Salvia, MD 50 mL/hr at 10/23/13 [phone]    . chlorhexidine (HIBICLENS) 4 % liquid 4 application  60 mL Topical Once Duke Salvia, MD      . gentamicin (GARAMYCIN) 80 mg in sodium chloride irrigation 0.9 % 500 mL irrigation  80 mg Irrigation On Call Duke Salvia, MD      . mupirocin ointment Idelle Jo) 2 %   Nasal BID Duke Salvia, MD      . vancomycin (VANCOCIN) IVPB 1000 mg/200 mL premix  1,000 mg  Intravenous On Call Duke Salvia, MD        Allergies  Allergen Reactions  . Penicillins Itching    Review of Systems negative except from HPI and PMH  Physical Exam BP 117/72  Pulse 68  Temp(Src) 98.2 F (36.8 C) (Oral)  Resp 18  Ht 5\' 7"  (1.702 m)  Wt 157 lb (71.215 kg)  BMI 24.58 kg/m2  SpO2 100% Well developed and well nourished in no acute distress HENT normal E scleral and icterus clear Neck Supple JVP flat; carotids brisk and full Clear to ausculation Device pocket well healed; without hematoma or erythema.  There is no tethering Regular rate and rhythm, no murmurs gallops or rub Soft with active bowel sounds No clubbing cyanosis noEdema Alert and oriented, grossly normal motor and sensory function Skin Warm and Dry  ECG sinus 64 LBBB  Assessment and  Plan  NICM  intrerval improvement in EF>>40-45%  6959 Lead  [facility] ICD at [facility]  CHF class 2b  For gen replacement today with replacing of 6949 lead.  With imporvement in EF  And not pacing not candidate for CRT although will likely need in the future We have reviewed the benefits and risks of generator replacement.  These include but are not limited to lead fracture and infection.  The patient understands, agrees and is  willing to proceed.

## 2013-10-23 NOTE — Interval H&P Note (Signed)
History and Physical Interval Note:  10/23/2013 8:15 AM  Robert Massey  has presented today for surgery, with the diagnosis of chf  The various methods of treatment have been discussed with the patient and family. After consideration of risks, benefits and other options for treatment, the patient has consented to  Procedure(s): BI-VENTRICULAR IMPLANTABLE CARDIOVERTER DEFIBRILLATOR  (CRT-D) (N/A) as a surgical intervention .  The patient's history has been reviewed, patient examined, no change in status, stable for surgery.  I have reviewed the patient's chart and labs.  Questions were answered to the patient's satisfaction.     Sherryl Manges Procedure is generator replacment and ICD lead revision with venography  ICD Criteria  Current LVEF (within 6 months):40-45%  NYHA Functional Classification: Class II  Heart Failure History:  Yes, Duration of heart failure since onset is > 9 months  Non-Ischemic Dilated Cardiomyopathy History:  Yes, timeframe is > 9 months  Atrial Fibrillation/Atrial Flutter:  No.  Ventricular Tachycardia History:  No.  Cardiac Arrest History:  No  History of Syndromes with Risk of Sudden Death:  No.  Previous ICD:  Yes, ICD Type:  Single, Reason for ICD:  Primary prevention.  LVEF is not available  Electrophysiology Study: No.  Anticoagulation Therapy:  Patient is NOT on anticoagulation therapy.   Beta Blocker Therapy:  Yes.   Ace Inhibitor/ARB Therapy:  Yes.

## 2013-10-23 NOTE — CV Procedure (Signed)
Preoperative diagnosis NICM previous ICD. LBBB, 6949 lead on recall Postoperative diagnosis same/   Procedure: Generator replacement  Lead insertion venogram  Following informed consent the patient was brought to the electrophysiology laboratory in place of the fluoroscopic table in the supine position after routine prep and drape lidocaine was infiltrated in the region of the previous incision and carried down to later the device pocket using sharp dissection and electrocautery. The pocket was opened the device was freed up and was explanted.  The previously implanted ICD ventricular lead was capped and abandoned 2/2 recall  A new lead was placed without difficulty and fluoroscopic eval demonstrated good spacing from the previous ICD lead      The leads were inspected. Repair was not  needed. The leads were then attached to a Medtronic Evera pulse generator, serial number [id number].    Through the device the R wave of 8.9  millivolts., and impedance of 817 ohms, and a pacing threshold of 0.5 volts at 0.4 msec    High voltage impedances were 75 ohms  The pocket was irrigated with antibiotic containing saline solution hemostasis was assured and the leads and the device were placed in the pocket. The wound was then closed in  layers in normal fashion. A DERMABOND dressing was applied  The patient tolerated the procedure without apparent complication.  DFT testing was not  performed  Sherryl Manges   \

## 2013-10-23 NOTE — Progress Notes (Signed)
Patient states he will be going home today per the doctor I called Dr. Graciela Husbands to verify and patient can leave at 7pm and CXR is to be done 5pm  I will continue to monitor patient until then.

## 2013-10-25 ENCOUNTER — Encounter (HOSPITAL_COMMUNITY): Payer: Self-pay | Admitting: *Deleted

## 2013-11-06 ENCOUNTER — Encounter: Payer: Self-pay | Admitting: Internal Medicine

## 2013-11-06 ENCOUNTER — Ambulatory Visit (INDEPENDENT_AMBULATORY_CARE_PROVIDER_SITE_OTHER): Payer: Medicare PPO | Admitting: *Deleted

## 2013-11-06 DIAGNOSIS — I428 Other cardiomyopathies: Secondary | ICD-10-CM

## 2013-11-06 DIAGNOSIS — I509 Heart failure, unspecified: Secondary | ICD-10-CM

## 2013-11-06 LAB — MDC_IDC_ENUM_SESS_TYPE_INCLINIC
Battery Remaining Longevity: 138 mo
Brady Statistic RV Percent Paced: 0.01 %
HighPow Impedance: 190 Ohm
HighPow Impedance: 66 Ohm
Lead Channel Impedance Value: 589 Ohm
Lead Channel Pacing Threshold Amplitude: 0.375 V
Lead Channel Sensing Intrinsic Amplitude: 8.25 mV
Lead Channel Setting Pacing Amplitude: 3.5 V
Lead Channel Setting Pacing Pulse Width: 0.4 ms
Lead Channel Setting Sensing Sensitivity: 0.3 mV
Zone Setting Detection Interval: 250 ms
Zone Setting Detection Interval: 330 ms
Zone Setting Detection Interval: 360 ms

## 2013-11-06 NOTE — Progress Notes (Signed)
Wound check appointment. Wound without redness or edema. Incision edges approximated, wound well healed. Normal device function. Threshold, sensing, and impedances consistent with implant measurements. Device programmed at 3.5V for extra safety margin until 3 month visit. Histogram distribution appropriate for patient and level of activity. No ventricular arrhythmias noted. Patient educated about wound care, arm mobility, lifting restrictions, shock plan. ROV with SK on 01-27-2014

## 2013-11-07 ENCOUNTER — Encounter: Payer: Medicare PPO | Admitting: Internal Medicine

## 2013-11-29 ENCOUNTER — Ambulatory Visit (INDEPENDENT_AMBULATORY_CARE_PROVIDER_SITE_OTHER): Payer: Medicare HMO | Admitting: Cardiology

## 2013-11-29 ENCOUNTER — Encounter: Payer: Self-pay | Admitting: Cardiology

## 2013-11-29 VITALS — BP 151/85 | HR 101 | Ht 68.0 in | Wt 156.0 lb

## 2013-11-29 DIAGNOSIS — I428 Other cardiomyopathies: Secondary | ICD-10-CM

## 2013-11-29 DIAGNOSIS — Z95810 Presence of automatic (implantable) cardiac defibrillator: Secondary | ICD-10-CM

## 2013-11-29 DIAGNOSIS — I10 Essential (primary) hypertension: Secondary | ICD-10-CM

## 2013-11-29 DIAGNOSIS — I509 Heart failure, unspecified: Secondary | ICD-10-CM

## 2013-11-29 LAB — BASIC METABOLIC PANEL
BUN: 12 mg/dL (ref 6–23)
CO2: 29 mEq/L (ref 19–32)
Calcium: 9.8 mg/dL (ref 8.4–10.5)
Chloride: 102 mEq/L (ref 96–112)
Creatinine, Ser: 0.8 mg/dL (ref 0.4–1.5)

## 2013-11-29 NOTE — Assessment & Plan Note (Signed)
Monitored by electrophysiology. 

## 2013-11-29 NOTE — Progress Notes (Signed)
      HPI: [name] is a pleasant gentleman who has a history of nonischemic cardiomyopathy and he has had a prior ICD. Prior cardiac catheterization in October of 2003 revealed an ejection fraction of 10% and normal coronary arteries. His most recent echocardiogram was performed in November 2014 showed an ejection fraction of 40-45%, mild left ventricular hypertrophy, mild left atrial enlargement. Patient had generator change in November of 2014. Since he was last seen, the patient has dyspnea with more extreme activities but not with routine activities. It is relieved with rest. It is not associated with chest pain. There is no orthopnea, PND or pedal edema. There is no syncope or palpitations. There is no exertional chest pain.   Current Outpatient Prescriptions  Medication Sig Dispense Refill  . aspirin EC 81 MG tablet Take 1 tablet (81 mg total) by mouth daily.  30 tablet  11  . carvedilol (COREG) 12.5 MG tablet Take 1 tablet (12.5 mg total) by mouth 2 (two) times daily.  180 tablet  3  . Flax OIL Take 1 tablet by mouth daily.       . Multiple Vitamins-Minerals (MULTIVITAMIN WITH MINERALS) tablet Take 1 tablet by mouth daily.      . ramipril (ALTACE) 10 MG capsule Take 1 capsule (10 mg total) by mouth 2 times daily at 12 noon and 4 pm.  180 capsule  3  . spironolactone (ALDACTONE) 25 MG tablet Take 1 tablet (25 mg total) by mouth daily.  90 tablet  3  . tetrahydrozoline-zinc (VISINE-AC) 0.05-0.25 % ophthalmic solution Place 2 drops into both eyes 3 (three) times daily as needed (allergies).       No current facility-administered medications for this visit.     Past Medical History  Diagnosis Date  . Hypertension   . Systolic CHF, chronic   . LBBB (left bundle branch block)   . dilated cardiomyopathy   . ALCOHOL ABUSE 06/18/2009  . FATTY LIVER DISEASE 02/14/2008  . Sinus bradycardia     Past Surgical History  Procedure Laterality Date  . Cardiac catheterization  09/12/2002  .  Implantable cardioverter defibrillator implant  2014    Medtronic Maximo [id number]; gen change and RV lead revision 10-23-2013 by Dr Graciela Husbands    History   Social History  . Marital Status: Married    Spouse Name: N/A    Number of Children: N/A  . Years of Education: N/A   Occupational History  . Not on file.   Social History Main Topics  . Smoking status: Never Smoker   . Smokeless tobacco: Never Used  . Alcohol Use: Yes     Comment: i HAVE NOT DRANK IN 2& HALF YEARS"  . Drug Use: No  . Sexual Activity: Not on file   Other Topics Concern  . Not on file   Social History Narrative  . No narrative on file    ROS: no fevers or chills, productive cough, hemoptysis, dysphasia, odynophagia, melena, hematochezia, dysuria, hematuria, rash, seizure activity, orthopnea, PND, pedal edema, claudication. Remaining systems are negative.  Physical Exam: Well-developed well-nourished in no acute distress.  Skin is warm and dry.  HEENT is normal.  Neck is supple.  Chest is clear to auscultation with normal expansion. ICD left chest Cardiovascular exam is regular rate and rhythm.  Abdominal exam nontender or distended. No masses palpated. Extremities show no edema. neuro grossly intact

## 2013-11-29 NOTE — Assessment & Plan Note (Signed)
Blood pressure is elevated but he has not yet taken his medications today. He states it is typically 120/60. Continue present medications and monitor.

## 2013-11-29 NOTE — Patient Instructions (Signed)
Your physician wants you to follow-up in: 6 MONTHS WITH DR CRENSHAW You will receive a reminder letter in the mail two months in advance. If you don't receive a letter, please call our office to schedule the follow-up appointment.   Your physician recommends that you HAVE LAB WORK TODAY 

## 2013-11-29 NOTE — Assessment & Plan Note (Signed)
Patient is euvolemic on examination. Continue present dose of diuretic. 

## 2013-11-29 NOTE — Assessment & Plan Note (Signed)
Continued ACE inhibitor and beta blocker. Check potassium and renal function. Check BNP.

## 2014-01-16 IMAGING — CR DG CHEST 2V
2 series · 2 of 2 positions shown · non-contrast
Comparison: DG CHEST 2 VIEW dated 06/16/2004 DG CHEST 2 VIEW dated
06/16/2004

CLINICAL DATA: post implant defibrillator

EXAM:
CHEST  2 VIEW

[w chest pa]
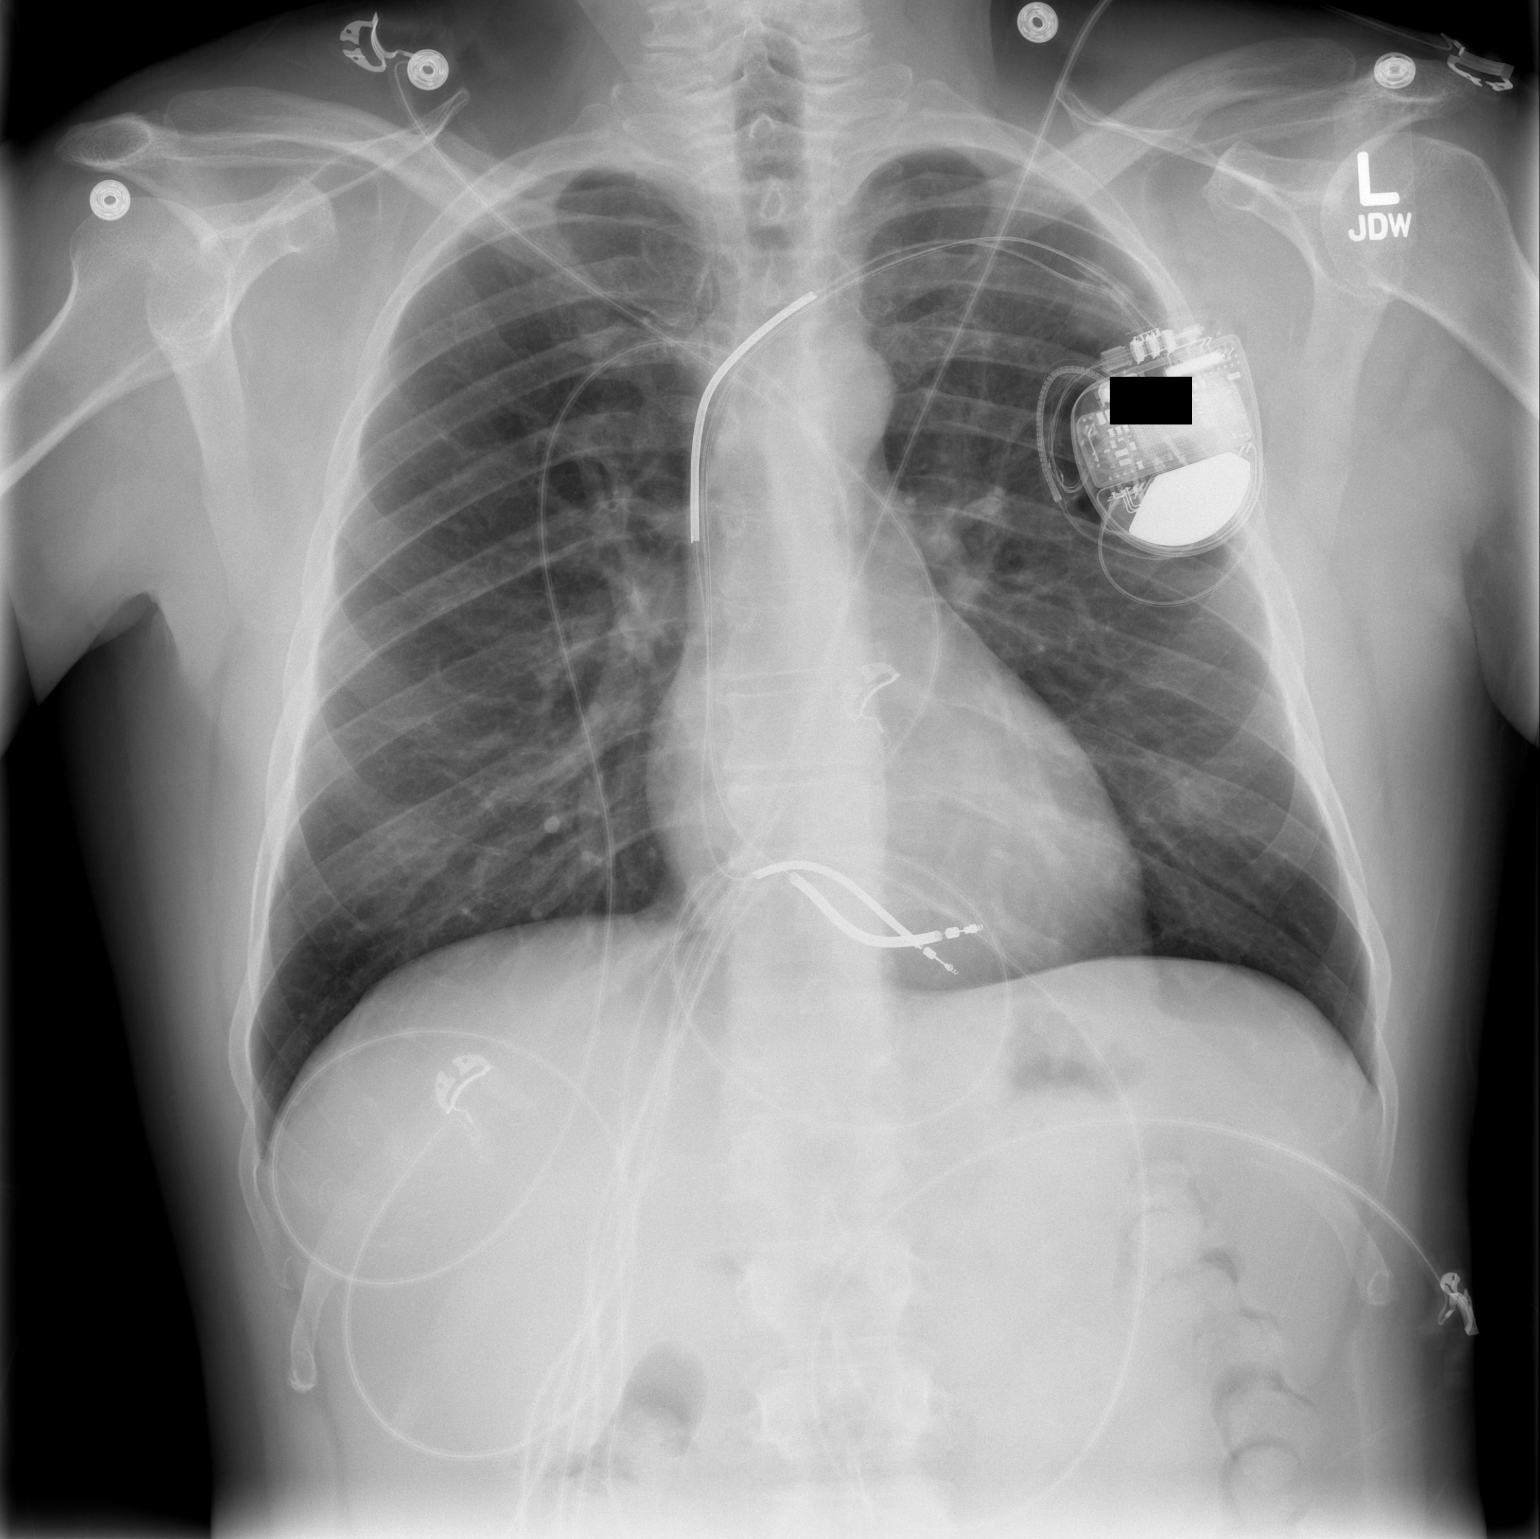

[w chest lat]
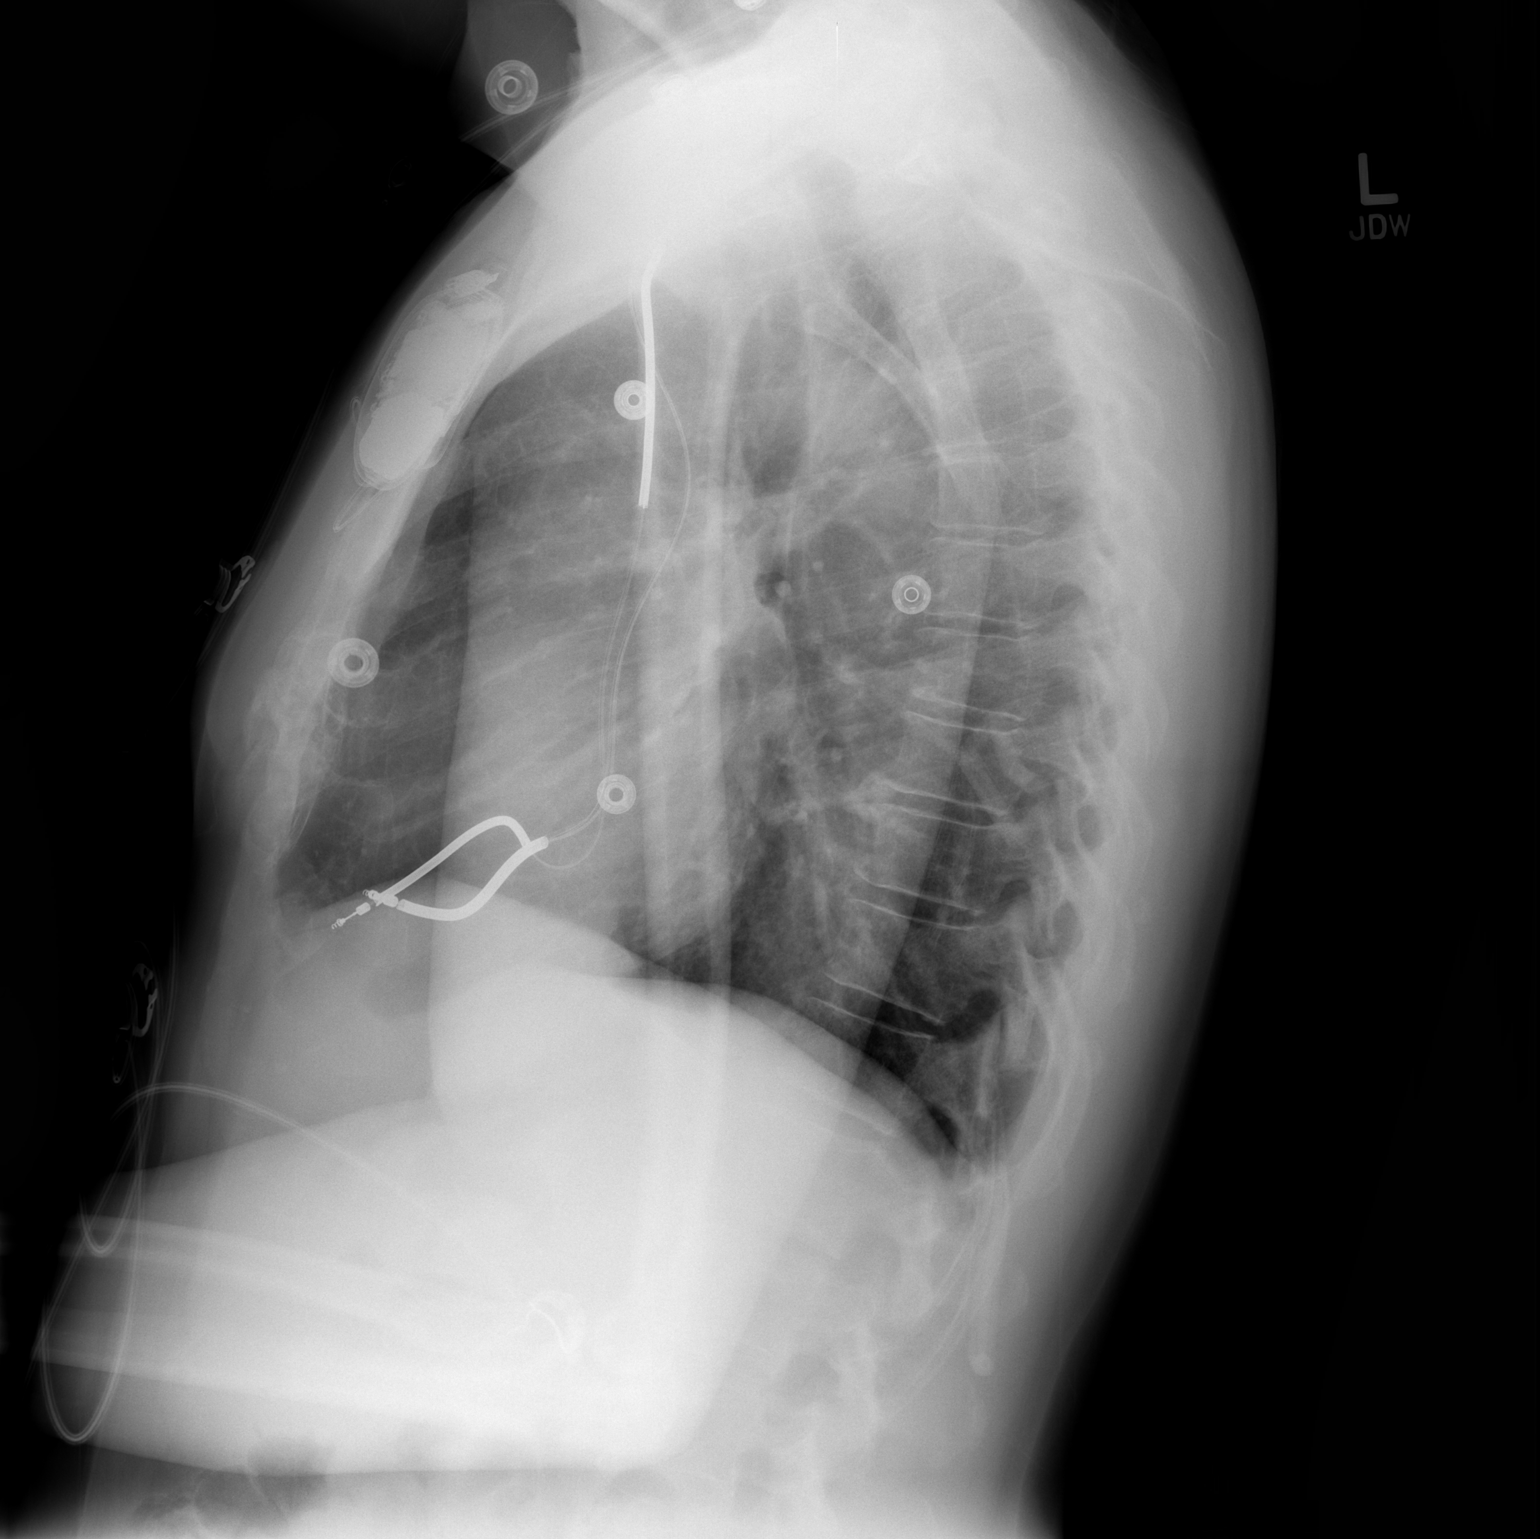

[2 of 2 positions shown; findings below may reference images not displayed]

FINDINGS: Interval placement of a left-sided defibrillator with continuous
leads which overlie normal cardiac silhouette. There is no effusion,
infiltrate, or pneumothorax.
IMPRESSION: No complication following defibrillator placement

## 2014-01-27 ENCOUNTER — Ambulatory Visit (INDEPENDENT_AMBULATORY_CARE_PROVIDER_SITE_OTHER): Payer: Medicare HMO | Admitting: Internal Medicine

## 2014-01-27 ENCOUNTER — Encounter: Payer: Self-pay | Admitting: Internal Medicine

## 2014-01-27 VITALS — BP 145/93 | HR 62 | Ht 68.0 in | Wt 158.8 lb

## 2014-01-27 DIAGNOSIS — Z95810 Presence of automatic (implantable) cardiac defibrillator: Secondary | ICD-10-CM

## 2014-01-27 DIAGNOSIS — R001 Bradycardia, unspecified: Secondary | ICD-10-CM

## 2014-01-27 DIAGNOSIS — I428 Other cardiomyopathies: Secondary | ICD-10-CM

## 2014-01-27 DIAGNOSIS — I5022 Chronic systolic (congestive) heart failure: Secondary | ICD-10-CM

## 2014-01-27 DIAGNOSIS — I498 Other specified cardiac arrhythmias: Secondary | ICD-10-CM

## 2014-01-27 DIAGNOSIS — I509 Heart failure, unspecified: Secondary | ICD-10-CM

## 2014-01-27 LAB — MDC_IDC_ENUM_SESS_TYPE_INCLINIC
Battery Voltage: 3.16 V
Brady Statistic RV Percent Paced: 0.01 %
HIGH POWER IMPEDANCE MEASURED VALUE: 247 Ohm
HighPow Impedance: 68 Ohm
Lead Channel Impedance Value: 646 Ohm
Lead Channel Pacing Threshold Amplitude: 0.5 V
Lead Channel Sensing Intrinsic Amplitude: 7.75 mV
Lead Channel Setting Pacing Amplitude: 2.5 V
Lead Channel Setting Pacing Pulse Width: 0.4 ms
Lead Channel Setting Sensing Sensitivity: 0.3 mV
MDC IDC MSMT BATTERY REMAINING LONGEVITY: 137 mo
MDC IDC MSMT LEADCHNL RV PACING THRESHOLD PULSEWIDTH: 0.4 ms
MDC IDC SESS DTM: 20150223143255
MDC IDC SET ZONE DETECTION INTERVAL: 250 ms
MDC IDC SET ZONE DETECTION INTERVAL: 360 ms
Zone Setting Detection Interval: 290 ms
Zone Setting Detection Interval: 330 ms

## 2014-01-27 LAB — BASIC METABOLIC PANEL
BUN: 13 mg/dL (ref 6–23)
CALCIUM: 9.7 mg/dL (ref 8.4–10.5)
CO2: 23 mEq/L (ref 19–32)
Chloride: 109 mEq/L (ref 96–112)
Creatinine, Ser: 0.9 mg/dL (ref 0.4–1.5)
GFR: 92.39 mL/min (ref >60.00)
GLUCOSE: 100 mg/dL — AB (ref 70–99)
POTASSIUM: 4.4 meq/L (ref 3.5–5.1)
SODIUM: 143 meq/L (ref 135–145)

## 2014-01-27 NOTE — Progress Notes (Signed)
      Patient Care Team: [name], MD as PCP - General (Unknown Physician Specialty)   HPI  Robert Massey is a 53 y.o. male Seen in followup for nonischemic cardiomyopathy for which he is status post ICD implantation. 11/14 he underwent generator system replacement with a new lead to replace a 6949-lead and a new generator at [facility]  The patient denies chest pain, shortness of breath, nocturnal dyspnea, orthopnea or peripheral edema.  There have been no palpitations, lightheadedness or syncope.    Past Medical History  Diagnosis Date  . Hypertension   . Systolic CHF, chronic   . LBBB (left bundle branch block)   . dilated cardiomyopathy   . ALCOHOL ABUSE 06/18/2009  . FATTY LIVER DISEASE 02/14/2008  . Sinus bradycardia   . ICD-Medtronic 11/08/2011    generator replacment 11/14  . 6949 lead removed from service 11/14     Past Surgical History  Procedure Laterality Date  . Cardiac catheterization  09/12/2002  . Implantable cardioverter defibrillator implant  2014    Medtronic Maximo 7232; gen change and RV lead revision 10-23-2013 by Dr Graciela Husbands    Current Outpatient Prescriptions  Medication Sig Dispense Refill  . aspirin EC 81 MG tablet Take 1 tablet (81 mg total) by mouth daily.  30 tablet  11  . carvedilol (COREG) 12.5 MG tablet Take 1 tablet (12.5 mg total) by mouth 2 (two) times daily.  180 tablet  3  . Flax OIL Take 1 tablet by mouth daily.       . Multiple Vitamins-Minerals (MULTIVITAMIN WITH MINERALS) tablet Take 1 tablet by mouth daily.      . ramipril (ALTACE) 10 MG capsule Take 1 capsule (10 mg total) by mouth 2 times daily at 12 noon and 4 pm.  180 capsule  3  . spironolactone (ALDACTONE) 25 MG tablet Take 1 tablet (25 mg total) by mouth daily.  90 tablet  3   No current facility-administered medications for this visit.    Allergies  Allergen Reactions  . Penicillins Itching    Review of Systems negative except from HPI and PMH  Physical Exam BP  145/93  Pulse 62  Ht 5\' 8"  (1.727 m)  Wt 158 lb 12.8 oz (72.031 kg)  BMI 24.15 kg/m2 Well developed and well nourished in no acute distress HENT normal E scleral and icterus clear Neck Supple JVP flat; carotids brisk and full Clear to ausculation Device pocket well healed; without hematoma or erythema.  There is no tethering   egular rate and rhythm, no murmurs gallops or rub Soft with active bowel sounds No clubbing cyanosis none Edema Alert and oriented, grossly normal motor and sensory function Skin Warm and Dry   ECG demonstrates sinus rhythm at 62 Intervals 16/15/44 Axis -60 Left bundle branch block   Assessment and  Plan  Nonischemic cardiomyopathy  Continue current meds  check K today on aldactone  Sinus bradycardia stable  ICD-primary prevention  No intercurrent Ventricular tachycardia The patient's device was interrogated.  The information was reviewed. No changes were made in the programming.   I am not sure why he didn't get CRT upgrade at generator replacement  CHF chronic systolic euvolemic

## 2014-01-27 NOTE — Patient Instructions (Addendum)
Your physician recommends that you have lab work today: BMET  Your physician recommends that you continue on your current medications as directed. Please refer to the Current Medication list given to you today.  Remote monitoring is used to monitor your Pacemaker of ICD from home. This monitoring reduces the number of office visits required to check your device to one time per year. It allows [place] to keep an eye on the functioning of your device to ensure it is working properly. You are scheduled for a device check from home on 04/30/2014. You may send your transmission at any time that day. If you have a wireless device, the transmission will be sent automatically. After your physician reviews your transmission, you will receive a postcard with your next transmission date.  Your physician wants you to follow-up in: June with Dr. Jens Som. You will receive a reminder letter in the mail two months in advance. If you don't receive a letter, please call our office to schedule the follow-up appointment.  Your physician wants you to follow-up in: December with Dr. Graciela Husbands.  You will receive a reminder letter in the mail two months in advance. If you don't receive a letter, please call our office to schedule the follow-up appointment.

## 2014-02-03 NOTE — Progress Notes (Signed)
Quick Note:  Patient notified of lab results and Dr. Odessa Fleming comment that "labs are normal x mild elevation in blood sugar". Patient verbalized understanding and stated he would keep a check on it. ______

## 2014-02-22 ENCOUNTER — Other Ambulatory Visit: Payer: Self-pay | Admitting: Cardiology

## 2014-04-25 ENCOUNTER — Encounter: Payer: Self-pay | Admitting: Internal Medicine

## 2014-04-30 ENCOUNTER — Ambulatory Visit (INDEPENDENT_AMBULATORY_CARE_PROVIDER_SITE_OTHER): Payer: Medicare HMO | Admitting: *Deleted

## 2014-04-30 DIAGNOSIS — I498 Other specified cardiac arrhythmias: Secondary | ICD-10-CM

## 2014-04-30 DIAGNOSIS — I509 Heart failure, unspecified: Secondary | ICD-10-CM

## 2014-04-30 DIAGNOSIS — R001 Bradycardia, unspecified: Secondary | ICD-10-CM

## 2014-04-30 DIAGNOSIS — I428 Other cardiomyopathies: Secondary | ICD-10-CM

## 2014-05-12 LAB — MDC_IDC_ENUM_SESS_TYPE_REMOTE
Battery Voltage: 3.12 V
Brady Statistic RV Percent Paced: 0.01 %
Date Time Interrogation Session: 20150522202948
HIGH POWER IMPEDANCE MEASURED VALUE: 247 Ohm
HighPow Impedance: 74 Ohm
Lead Channel Impedance Value: 608 Ohm
Lead Channel Pacing Threshold Amplitude: 0.5 V
Lead Channel Sensing Intrinsic Amplitude: 8 mV
Lead Channel Setting Pacing Amplitude: 2.5 V
Lead Channel Setting Sensing Sensitivity: 0.3 mV
MDC IDC MSMT BATTERY REMAINING LONGEVITY: 136 mo
MDC IDC MSMT LEADCHNL RV PACING THRESHOLD PULSEWIDTH: 0.4 ms
MDC IDC SET LEADCHNL RV PACING PULSEWIDTH: 0.4 ms
MDC IDC SET ZONE DETECTION INTERVAL: 250 ms
MDC IDC SET ZONE DETECTION INTERVAL: 330 ms
Zone Setting Detection Interval: 290 ms
Zone Setting Detection Interval: 360 ms

## 2014-06-04 ENCOUNTER — Encounter: Payer: Self-pay | Admitting: Cardiology

## 2014-06-09 ENCOUNTER — Encounter: Payer: Self-pay | Admitting: Cardiology

## 2014-07-28 ENCOUNTER — Other Ambulatory Visit: Payer: Self-pay | Admitting: Cardiology

## 2014-07-29 ENCOUNTER — Ambulatory Visit (INDEPENDENT_AMBULATORY_CARE_PROVIDER_SITE_OTHER): Payer: Medicare HMO | Admitting: *Deleted

## 2014-07-29 ENCOUNTER — Telehealth: Payer: Self-pay | Admitting: Cardiology

## 2014-07-29 ENCOUNTER — Encounter: Payer: Self-pay | Admitting: Internal Medicine

## 2014-07-29 DIAGNOSIS — I509 Heart failure, unspecified: Secondary | ICD-10-CM

## 2014-07-29 DIAGNOSIS — I428 Other cardiomyopathies: Secondary | ICD-10-CM

## 2014-07-29 NOTE — Progress Notes (Signed)
Remote ICD transmission.   

## 2014-07-29 NOTE — Telephone Encounter (Signed)
Spoke with pt and reminded pt of remote transmission that is due today. Pt verbalized understanding.   

## 2014-08-01 LAB — MDC_IDC_ENUM_SESS_TYPE_REMOTE
Battery Remaining Longevity: 135 mo
Battery Voltage: 3.08 V
Date Time Interrogation Session: 20150825190618
HIGH POWER IMPEDANCE MEASURED VALUE: 285 Ohm
HIGH POWER IMPEDANCE MEASURED VALUE: 76 Ohm
Lead Channel Impedance Value: 760 Ohm
Lead Channel Sensing Intrinsic Amplitude: 9 mV
Lead Channel Setting Pacing Amplitude: 2.5 V
Lead Channel Setting Pacing Pulse Width: 0.4 ms
Lead Channel Setting Sensing Sensitivity: 0.3 mV
MDC IDC MSMT LEADCHNL RV PACING THRESHOLD AMPLITUDE: 0.5 V
MDC IDC MSMT LEADCHNL RV PACING THRESHOLD PULSEWIDTH: 0.4 ms
MDC IDC MSMT LEADCHNL RV SENSING INTR AMPL: 9 mV
MDC IDC SET ZONE DETECTION INTERVAL: 250 ms
MDC IDC STAT BRADY RV PERCENT PACED: 0.01 %
Zone Setting Detection Interval: 290 ms
Zone Setting Detection Interval: 330 ms
Zone Setting Detection Interval: 360 ms

## 2014-08-14 ENCOUNTER — Encounter: Payer: Self-pay | Admitting: Cardiology

## 2014-08-19 ENCOUNTER — Encounter: Payer: Self-pay | Admitting: Cardiology

## 2014-09-09 ENCOUNTER — Other Ambulatory Visit: Payer: Self-pay | Admitting: Cardiology

## 2014-09-11 ENCOUNTER — Ambulatory Visit (INDEPENDENT_AMBULATORY_CARE_PROVIDER_SITE_OTHER): Payer: Medicare HMO | Admitting: Cardiology

## 2014-09-11 ENCOUNTER — Encounter: Payer: Self-pay | Admitting: Cardiology

## 2014-09-11 VITALS — BP 140/91 | HR 59 | Ht 68.0 in | Wt 151.7 lb

## 2014-09-11 DIAGNOSIS — Z95810 Presence of automatic (implantable) cardiac defibrillator: Secondary | ICD-10-CM

## 2014-09-11 DIAGNOSIS — I10 Essential (primary) hypertension: Secondary | ICD-10-CM

## 2014-09-11 DIAGNOSIS — I5022 Chronic systolic (congestive) heart failure: Secondary | ICD-10-CM

## 2014-09-11 LAB — BASIC METABOLIC PANEL WITH GFR
BUN: 10 mg/dL (ref 6–23)
CHLORIDE: 102 meq/L (ref 96–112)
CO2: 26 meq/L (ref 19–32)
Calcium: 9.4 mg/dL (ref 8.4–10.5)
Creat: 0.72 mg/dL (ref 0.50–1.35)
GFR, Est African American: >89 mL/min
Glucose, Bld: 95 mg/dL (ref 70–99)
POTASSIUM: 5 meq/L (ref 3.5–5.3)
Sodium: 140 mEq/L (ref 135–145)

## 2014-09-11 NOTE — Assessment & Plan Note (Signed)
Continue present medications. 

## 2014-09-11 NOTE — Assessment & Plan Note (Signed)
Followed by electrophysiology. 

## 2014-09-11 NOTE — Progress Notes (Signed)
      HPI: Robert Massey is a pleasant gentleman who has a history of nonischemic cardiomyopathy and he has had a prior ICD. Prior cardiac catheterization in October of 2003 revealed an ejection fraction of 10% and normal coronary arteries. His most recent echocardiogram was performed in November 2014 showed an ejection fraction of 40-45%, mild left ventricular hypertrophy, mild left atrial enlargement. Patient had generator change in November of 2014. Since he was last seen, the patient has dyspnea with more extreme activities but not with routine activities. It is relieved with rest. It is not associated with chest pain. There is no orthopnea, PND or pedal edema. There is no syncope or palpitations. There is no exertional chest pain.    Current Outpatient Prescriptions  Medication Sig Dispense Refill  . aspirin EC 81 MG tablet Take 1 tablet (81 mg total) by mouth daily.  30 tablet  11  . carvedilol (COREG) 12.5 MG tablet TAKE 1 TABLET TWICE DAILY  180 tablet  1  . Flax OIL Take 1 tablet by mouth daily.       . Multiple Vitamins-Minerals (MULTIVITAMIN WITH MINERALS) tablet Take 1 tablet by mouth daily.      . ramipril (ALTACE) 10 MG capsule TAKE 1 CAPSULE TWICE DAILY AT 12 NOON AND AT 4 PM.  180 capsule  0  . spironolactone (ALDACTONE) 25 MG tablet TAKE 1 TABLET EVERY DAY  90 tablet  1   No current facility-administered medications for this visit.     Past Medical History  Diagnosis Date  . Hypertension   . Systolic CHF, chronic   . LBBB (left bundle branch block)   . dilated cardiomyopathy   . ALCOHOL ABUSE 06/18/2009  . FATTY LIVER DISEASE 02/14/2008  . Sinus bradycardia   . ICD-Medtronic 11/08/2011    generator replacment 11/14  . 6949 lead removed from service 11/14     Past Surgical History  Procedure Laterality Date  . Cardiac catheterization  09/12/2002  . Implantable cardioverter defibrillator implant  2014    Medtronic Maximo 7232; gen change and RV lead revision  10-23-2013 by Dr Graciela Husbands    History   Social History  . Marital Status: Married    Spouse Name: N/A    Number of Children: N/A  . Years of Education: N/A   Occupational History  . Not on file.   Social History Main Topics  . Smoking status: Never Smoker   . Smokeless tobacco: Never Used  . Alcohol Use: Yes     Comment: i HAVE NOT DRANK IN 2& HALF YEARS"  . Drug Use: No  . Sexual Activity: Not on file   Other Topics Concern  . Not on file   Social History Narrative  . No narrative on file    ROS: no fevers or chills, productive cough, hemoptysis, dysphasia, odynophagia, melena, hematochezia, dysuria, hematuria, rash, seizure activity, orthopnea, PND, pedal edema, claudication. Remaining systems are negative.  Physical Exam: Well-developed well-nourished in no acute distress.  Skin is warm and dry.  HEENT is normal.  Neck is supple.  Chest is clear to auscultation with normal expansion.  Cardiovascular exam is regular rate and rhythm.  Abdominal exam nontender or distended. No masses palpated. Extremities show no edema. neuro grossly intact  ECG Sinus bradycardia, left bundle branch block.

## 2014-09-11 NOTE — Patient Instructions (Signed)
Your physician wants you to follow-up in: 8 MONTHS WITH DR Jens Som You will receive a reminder letter in the mail two months in advance. If you don't receive a letter, please call our office to schedule the follow-up appointment.   Your physician recommends that you HAVE LAB WORK TODAY

## 2014-09-11 NOTE — Assessment & Plan Note (Signed)
Blood pressure mildly elevated but he has not taken his medications yet this morning. It is typically controlled. Continue present medications.

## 2014-09-11 NOTE — Assessment & Plan Note (Signed)
Patient euvolemic on examination.Continue present dose of diuretics. Check potassium and renal function. 

## 2014-09-19 ENCOUNTER — Other Ambulatory Visit: Payer: Self-pay

## 2014-11-06 ENCOUNTER — Ambulatory Visit (INDEPENDENT_AMBULATORY_CARE_PROVIDER_SITE_OTHER): Payer: Medicare HMO | Admitting: Internal Medicine

## 2014-11-06 ENCOUNTER — Encounter: Payer: Self-pay | Admitting: Internal Medicine

## 2014-11-06 VITALS — BP 142/82 | HR 64 | Ht 68.0 in | Wt 157.0 lb

## 2014-11-06 DIAGNOSIS — I5022 Chronic systolic (congestive) heart failure: Secondary | ICD-10-CM

## 2014-11-06 DIAGNOSIS — R001 Bradycardia, unspecified: Secondary | ICD-10-CM

## 2014-11-06 DIAGNOSIS — I428 Other cardiomyopathies: Secondary | ICD-10-CM

## 2014-11-06 DIAGNOSIS — I429 Cardiomyopathy, unspecified: Secondary | ICD-10-CM

## 2014-11-06 DIAGNOSIS — Z4502 Encounter for adjustment and management of automatic implantable cardiac defibrillator: Secondary | ICD-10-CM

## 2014-11-06 LAB — MDC_IDC_ENUM_SESS_TYPE_INCLINIC
Battery Remaining Longevity: 133 mo
Battery Voltage: 3.05 V
Brady Statistic RV Percent Paced: 0.01 %
Date Time Interrogation Session: 20151203152905
HIGH POWER IMPEDANCE MEASURED VALUE: 285 Ohm
HighPow Impedance: 81 Ohm
Lead Channel Impedance Value: 608 Ohm
Lead Channel Pacing Threshold Amplitude: 0.5 V
Lead Channel Sensing Intrinsic Amplitude: 8.625 mV
Lead Channel Setting Pacing Amplitude: 2.5 V
Lead Channel Setting Sensing Sensitivity: 0.3 mV
MDC IDC MSMT LEADCHNL RV PACING THRESHOLD PULSEWIDTH: 0.4 ms
MDC IDC SET LEADCHNL RV PACING PULSEWIDTH: 0.4 ms
MDC IDC SET ZONE DETECTION INTERVAL: 330 ms
MDC IDC SET ZONE DETECTION INTERVAL: 360 ms
Zone Setting Detection Interval: 250 ms
Zone Setting Detection Interval: 290 ms

## 2014-11-06 MED ORDER — FUROSEMIDE 20 MG PO TABS: 20.0000 mg | ORAL_TABLET | Freq: Every day | ORAL | Status: DC

## 2014-11-06 NOTE — Patient Instructions (Addendum)
Your physician has recommended you make the following change in your medication:  1) START Furosemide 20 mg daily  Remote monitoring is used to monitor your Pacemaker of ICD from home. This monitoring reduces the number of office visits required to check your device to one time per year. It allows [place] to keep an eye on the functioning of your device to ensure it is working properly. You are scheduled for a device check from home on 02/05/15. You may send your transmission at any time that day. If you have a wireless device, the transmission will be sent automatically. After your physician reviews your transmission, you will receive a postcard with your next transmission date.  Your physician wants you to follow-up in: 1 year with Dr. Graciela Husbands.  You will receive a reminder letter in the mail two months in advance. If you don't receive a letter, please call our office to schedule the follow-up appointment.

## 2014-11-06 NOTE — Progress Notes (Signed)
      Patient Care Team: [name], MD as PCP - General (Unknown Physician Specialty)   HPI  Robert Massey is a 53 y.o. male Seen in followup for nonischemic cardiomyopathy for which he is status post ICD implantation. 11/14 he underwent generator system replacement with a new lead to replace a 6949-lead and a new generator at [facility]   There has been interval improvement in his EF so that notwithstanding his LBBB he did not udnergo CRT upgrade  (45%<<10%)  The patient denies chest pain,  , nocturnal dyspnea, orthopnea   There have been no palpitations, lightheadedness or syncope.   He has noted more DOE, but no edema  He drinks >6 bottles/day   Past Medical History  Diagnosis Date  . Hypertension   . Systolic CHF, chronic   . LBBB (left bundle branch block)   . dilated cardiomyopathy   . ALCOHOL ABUSE 06/18/2009  . FATTY LIVER DISEASE 02/14/2008  . Sinus bradycardia   . ICD-Medtronic 11/08/2011    generator replacment 11/14  . 6949 lead removed from service 11/14     Past Surgical History  Procedure Laterality Date  . Cardiac catheterization  09/12/2002  . Implantable cardioverter defibrillator implant  2014    Medtronic Maximo 7232; gen change and RV lead revision 10-23-2013 by Dr Graciela Husbands    Current Outpatient Prescriptions  Medication Sig Dispense Refill  . aspirin EC 81 MG tablet Take 1 tablet (81 mg total) by mouth daily. 30 tablet 11  . carvedilol (COREG) 12.5 MG tablet TAKE 1 TABLET TWICE DAILY 180 tablet 1  . Flax OIL Take 1 tablet by mouth daily.     . Multiple Vitamins-Minerals (MULTIVITAMIN WITH MINERALS) tablet Take 1 tablet by mouth daily.    . ramipril (ALTACE) 10 MG capsule TAKE 1 CAPSULE TWICE DAILY AT 12 NOON AND AT 4 PM. 180 capsule 0  . spironolactone (ALDACTONE) 25 MG tablet TAKE 1 TABLET EVERY DAY 90 tablet 1   No current facility-administered medications for this visit.    Allergies  Allergen Reactions  . Penicillins Itching    Review of  Systems negative except from HPI and PMH  Physical Exam BP 142/82 mmHg  Pulse 64  Ht 5\' 8"  (1.727 m)  Wt 157 lb (71.215 kg)  BMI 23.88 kg/m2  SpO2 99% Well developed and well nourished in no acute distress HENT normal E scleral and icterus clear Neck Supple JVP flat; carotids brisk and full Clear to ausculation Device pocket well healed; without hematoma or erythema.  There is no tethering   egular rate and rhythm, no murmurs gallops or rub Soft with active bowel sounds No clubbing cyanosis none Edema Alert and oriented, grossly normal motor and sensory function Skin Warm and Dry   ECG demonstrates sinus rhythm at 62 Intervals 16/15/44 Axis -60 Left bundle branch block   Assessment and  Plan  Nonischemic cardiomyopathy  Continue current meds  check K today on aldactone  Have discussed with DR [facility] the role of entresto  Sinus bradycardia stable  ICD-primary prevention  No intercurrent Ventricular tachycardia The patient's device was interrogated.  The information was reviewed. No changes were made in the programming.    LBBB  CHF chronic systolic euvolemic  But increasing symptoms of DOE we were going to add lasix but rather will have him decrease his volume intaike and see if it translates itno imporved exercise performance

## 2014-11-13 ENCOUNTER — Encounter (HOSPITAL_COMMUNITY): Payer: Self-pay | Admitting: Internal Medicine

## 2014-12-23 ENCOUNTER — Other Ambulatory Visit: Payer: Self-pay | Admitting: Cardiology

## 2015-02-05 ENCOUNTER — Ambulatory Visit (INDEPENDENT_AMBULATORY_CARE_PROVIDER_SITE_OTHER): Payer: Medicare HMO | Admitting: *Deleted

## 2015-02-05 DIAGNOSIS — I5022 Chronic systolic (congestive) heart failure: Secondary | ICD-10-CM

## 2015-02-05 DIAGNOSIS — I429 Cardiomyopathy, unspecified: Secondary | ICD-10-CM

## 2015-02-05 DIAGNOSIS — I428 Other cardiomyopathies: Secondary | ICD-10-CM

## 2015-02-05 LAB — MDC_IDC_ENUM_SESS_TYPE_REMOTE
Battery Remaining Longevity: 132 mo
Battery Voltage: 3.04 V
Brady Statistic RV Percent Paced: 0.01 %
Date Time Interrogation Session: 20160303160306
HighPow Impedance: 71 Ohm
Lead Channel Impedance Value: 513 Ohm
Lead Channel Impedance Value: 608 Ohm
Lead Channel Pacing Threshold Amplitude: 0.5 V
Lead Channel Pacing Threshold Pulse Width: 0.4 ms
Lead Channel Sensing Intrinsic Amplitude: 6 mV
Lead Channel Sensing Intrinsic Amplitude: 6 mV
Lead Channel Setting Pacing Amplitude: 2.5 V
Lead Channel Setting Pacing Pulse Width: 0.4 ms
Lead Channel Setting Sensing Sensitivity: 0.3 mV
Zone Setting Detection Interval: 250 ms
Zone Setting Detection Interval: 290 ms
Zone Setting Detection Interval: 330 ms
Zone Setting Detection Interval: 360 ms

## 2015-02-05 NOTE — Progress Notes (Signed)
Remote ICD transmission.   

## 2015-02-19 ENCOUNTER — Encounter: Payer: Self-pay | Admitting: Cardiology

## 2015-02-25 ENCOUNTER — Encounter: Payer: Self-pay | Admitting: Internal Medicine

## 2015-04-09 ENCOUNTER — Other Ambulatory Visit: Payer: Self-pay | Admitting: Cardiology

## 2015-05-11 ENCOUNTER — Ambulatory Visit (INDEPENDENT_AMBULATORY_CARE_PROVIDER_SITE_OTHER): Payer: Medicare HMO | Admitting: *Deleted

## 2015-05-11 DIAGNOSIS — I428 Other cardiomyopathies: Secondary | ICD-10-CM

## 2015-05-11 DIAGNOSIS — I5022 Chronic systolic (congestive) heart failure: Secondary | ICD-10-CM

## 2015-05-11 DIAGNOSIS — I429 Cardiomyopathy, unspecified: Secondary | ICD-10-CM

## 2015-05-12 NOTE — Progress Notes (Signed)
Remote ICD transmission.   

## 2015-05-14 LAB — CUP PACEART REMOTE DEVICE CHECK
Battery Remaining Longevity: 130 mo
Battery Voltage: 3.03 V
Brady Statistic RV Percent Paced: 0.01 %
Date Time Interrogation Session: 20160606092604
HighPow Impedance: 71 Ohm
Lead Channel Impedance Value: 475 Ohm
Lead Channel Pacing Threshold Amplitude: 0.625 V
Lead Channel Sensing Intrinsic Amplitude: 4.5 mV
Lead Channel Sensing Intrinsic Amplitude: 4.5 mV
Lead Channel Setting Pacing Amplitude: 2.5 V
Lead Channel Setting Pacing Pulse Width: 0.4 ms
Lead Channel Setting Sensing Sensitivity: 0.3 mV
MDC IDC MSMT LEADCHNL RV IMPEDANCE VALUE: 589 Ohm
MDC IDC MSMT LEADCHNL RV PACING THRESHOLD PULSEWIDTH: 0.4 ms
Zone Setting Detection Interval: 250 ms
Zone Setting Detection Interval: 290 ms
Zone Setting Detection Interval: 330 ms
Zone Setting Detection Interval: 360 ms

## 2015-05-27 ENCOUNTER — Encounter: Payer: Self-pay | Admitting: Cardiology

## 2015-06-02 ENCOUNTER — Other Ambulatory Visit: Payer: Self-pay | Admitting: Cardiology

## 2015-06-02 NOTE — Telephone Encounter (Signed)
REFILL 

## 2015-06-03 ENCOUNTER — Encounter: Payer: Self-pay | Admitting: Internal Medicine

## 2015-07-17 ENCOUNTER — Other Ambulatory Visit: Payer: Self-pay | Admitting: Cardiology

## 2015-07-20 ENCOUNTER — Telehealth: Payer: Self-pay | Admitting: Cardiology

## 2015-07-20 NOTE — Telephone Encounter (Signed)
Received records from [facility] for appointment with Dr Jens Som on 07/23/15.  Records given to [facility] (medical records) for Dr Ludwig Clarks schedule on 07/23/15. lp

## 2015-07-21 NOTE — Progress Notes (Signed)
HPI: Robert Massey is a pleasant gentleman who has a history of nonischemic cardiomyopathy and he has had a prior ICD. Prior cardiac catheterization in October of 2003 revealed an ejection fraction of 10% and normal coronary arteries. His most recent echocardiogram was performed in November 2014 showed an ejection fraction of 40-45%, mild left ventricular hypertrophy, mild left atrial enlargement. Patient had generator change in November of 2014. Since he was last seen, the patient has dyspnea with more extreme activities but not with routine activities. It is relieved with rest. It is not associated with chest pain. There is no orthopnea, PND or pedal edema. There is no syncope or palpitations. There is no exertional chest pain.   Current Outpatient Prescriptions  Medication Sig Dispense Refill  . aspirin EC 81 MG tablet Take 1 tablet (81 mg total) by mouth daily. 30 tablet 11  . carvedilol (COREG) 12.5 MG tablet TAKE 1 TABLET TWICE DAILY 180 tablet 1  . Flax OIL Take 1 tablet by mouth daily.     . furosemide (LASIX) 20 MG tablet Take 1 tablet (20 mg total) by mouth daily. 90 tablet 3  . Multiple Vitamins-Minerals (MULTIVITAMIN WITH MINERALS) tablet Take 1 tablet by mouth daily.    Marland Kitchen PARoxetine (PAXIL) 20 MG tablet Take 1 tablet by mouth daily. Take 1 tab daily  5  . ramipril (ALTACE) 10 MG capsule TAKE 1 CAPSULE TWICE DAILY AT 12 NOON AND AT 4PM 180 capsule 1  . spironolactone (ALDACTONE) 25 MG tablet TAKE 1 TABLET EVERY DAY 90 tablet 1   No current facility-administered medications for this visit.     Past Medical History  Diagnosis Date  . Hypertension   . Systolic CHF, chronic   . LBBB (left bundle branch block)   . dilated cardiomyopathy   . ALCOHOL ABUSE 06/18/2009  . FATTY LIVER DISEASE 02/14/2008  . Sinus bradycardia   . ICD-Medtronic 11/08/2011    generator replacment 11/14  . 6949 lead removed from service 11/14     Past Surgical History  Procedure Laterality Date    . Cardiac catheterization  09/12/2002  . Implantable cardioverter defibrillator implant  2014    Medtronic Maximo 7232; gen change and RV lead revision 10-23-2013 by Dr Graciela Husbands  . Bi-ventricular implantable cardioverter defibrillator N/A 10/23/2013    Procedure: BI-VENTRICULAR IMPLANTABLE CARDIOVERTER DEFIBRILLATOR  (CRT-D);  Surgeon: Duke Salvia, MD;  Location: [facility] CATH LAB;  Service: Cardiovascular;  Laterality: N/A;    Social History   Social History  . Marital Status: Married    Spouse Name: N/A  . Number of Children: N/A  . Years of Education: N/A   Occupational History  . Not on file.   Social History Main Topics  . Smoking status: Never Smoker   . Smokeless tobacco: Never Used  . Alcohol Use: Yes     Comment: i HAVE NOT DRANK IN 2& HALF YEARS"  . Drug Use: No  . Sexual Activity: Not on file   Other Topics Concern  . Not on file   Social History Narrative    ROS: no fevers or chills, productive cough, hemoptysis, dysphasia, odynophagia, melena, hematochezia, dysuria, hematuria, rash, seizure activity, orthopnea, PND, pedal edema, claudication. Remaining systems are negative.  Physical Exam: Well-developed well-nourished in no acute distress.  Skin is warm and dry.  HEENT is normal.  Neck is supple.  Chest is clear to auscultation with normal expansion.  Cardiovascular exam is regular rate and rhythm.  Abdominal  exam nontender or distended. No masses palpated. Extremities show no edema. neuro grossly intact  ECG sinus rhythm at a rate of 55. Left bundle branch block.

## 2015-07-23 ENCOUNTER — Encounter: Payer: Self-pay | Admitting: *Deleted

## 2015-07-23 ENCOUNTER — Ambulatory Visit (INDEPENDENT_AMBULATORY_CARE_PROVIDER_SITE_OTHER): Payer: Medicare HMO | Admitting: Cardiology

## 2015-07-23 ENCOUNTER — Encounter: Payer: Self-pay | Admitting: Cardiology

## 2015-07-23 VITALS — BP 100/68 | HR 55 | Ht 68.0 in | Wt 144.6 lb

## 2015-07-23 DIAGNOSIS — I428 Other cardiomyopathies: Secondary | ICD-10-CM

## 2015-07-23 DIAGNOSIS — Z95810 Presence of automatic (implantable) cardiac defibrillator: Secondary | ICD-10-CM

## 2015-07-23 DIAGNOSIS — I429 Cardiomyopathy, unspecified: Secondary | ICD-10-CM | POA: Diagnosis not present

## 2015-07-23 DIAGNOSIS — I5022 Chronic systolic (congestive) heart failure: Secondary | ICD-10-CM | POA: Diagnosis not present

## 2015-07-23 LAB — BASIC METABOLIC PANEL
BUN: 9 mg/dL (ref 7–25)
CO2: 30 mmol/L (ref 20–31)
Calcium: 9.2 mg/dL (ref 8.6–10.3)
Chloride: 103 mmol/L (ref 98–110)
Creat: 0.72 mg/dL (ref 0.70–1.33)
Glucose, Bld: 104 mg/dL — ABNORMAL HIGH (ref 65–99)
POTASSIUM: 4.9 mmol/L (ref 3.5–5.3)
SODIUM: 140 mmol/L (ref 135–146)

## 2015-07-23 NOTE — Assessment & Plan Note (Signed)
Patient appears to be euvolemic on examination. Continue present dose of diuretics. We discussed low-sodium diet. He will take additional 20 mg of Lasix for weight gain of 2-3 pounds.

## 2015-07-23 NOTE — Assessment & Plan Note (Signed)
Blood pressure controlled. Continue present medications. Check potassium and renal function. 

## 2015-07-23 NOTE — Patient Instructions (Signed)
Your physician wants you to follow-up in: 6 MONTHS WITH DR CRENSHAW You will receive a reminder letter in the mail two months in advance. If you don't receive a letter, please call our office to schedule the follow-up appointment.   Your physician has requested that you have an echocardiogram. Echocardiography is a painless test that uses sound waves to create images of your heart. It provides your doctor with information about the size and shape of your heart and how well your heart's chambers and valves are working. This procedure takes approximately one hour. There are no restrictions for this procedure.   Your physician recommends that you HAVE LAB WORK TODAY 

## 2015-07-23 NOTE — Assessment & Plan Note (Signed)
Continue ACE inhibitor and beta blocker. Repeat echocardiogram. 

## 2015-07-23 NOTE — Assessment & Plan Note (Signed)
Followed by electrophysiology. 

## 2015-08-03 ENCOUNTER — Other Ambulatory Visit: Payer: Self-pay

## 2015-08-03 ENCOUNTER — Ambulatory Visit (HOSPITAL_COMMUNITY): Payer: Medicare HMO | Attending: Cardiology

## 2015-08-03 DIAGNOSIS — I10 Essential (primary) hypertension: Secondary | ICD-10-CM | POA: Diagnosis not present

## 2015-08-03 DIAGNOSIS — I428 Other cardiomyopathies: Secondary | ICD-10-CM

## 2015-08-03 DIAGNOSIS — I429 Cardiomyopathy, unspecified: Secondary | ICD-10-CM | POA: Diagnosis present

## 2015-08-03 DIAGNOSIS — I517 Cardiomegaly: Secondary | ICD-10-CM | POA: Insufficient documentation

## 2015-08-03 DIAGNOSIS — I340 Nonrheumatic mitral (valve) insufficiency: Secondary | ICD-10-CM | POA: Diagnosis not present

## 2015-08-03 DIAGNOSIS — I071 Rheumatic tricuspid insufficiency: Secondary | ICD-10-CM | POA: Diagnosis not present

## 2015-08-11 ENCOUNTER — Ambulatory Visit (INDEPENDENT_AMBULATORY_CARE_PROVIDER_SITE_OTHER): Payer: Medicare HMO | Admitting: *Deleted

## 2015-08-11 ENCOUNTER — Encounter: Payer: Self-pay | Admitting: Internal Medicine

## 2015-08-11 DIAGNOSIS — I5022 Chronic systolic (congestive) heart failure: Secondary | ICD-10-CM

## 2015-08-11 DIAGNOSIS — I428 Other cardiomyopathies: Secondary | ICD-10-CM

## 2015-08-11 DIAGNOSIS — I429 Cardiomyopathy, unspecified: Secondary | ICD-10-CM

## 2015-08-12 NOTE — Progress Notes (Signed)
Remote ICD transmission.   

## 2015-08-18 LAB — CUP PACEART REMOTE DEVICE CHECK
Brady Statistic RV Percent Paced: 0.1 % — CL
Date Time Interrogation Session: 20160913075724
HighPow Impedance: 81 Ohm
Lead Channel Pacing Threshold Amplitude: 0.5 V
Lead Channel Pacing Threshold Pulse Width: 0.4 ms
Lead Channel Setting Pacing Amplitude: 2.5 V
MDC IDC MSMT LEADCHNL RV IMPEDANCE VALUE: 646 Ohm
MDC IDC MSMT LEADCHNL RV SENSING INTR AMPL: 7.4 mV
MDC IDC SET LEADCHNL RV PACING PULSEWIDTH: 0.4 ms
MDC IDC SET LEADCHNL RV SENSING SENSITIVITY: 0.3 mV
MDC IDC SET ZONE DETECTION INTERVAL: 290 ms
Zone Setting Detection Interval: 250 ms
Zone Setting Detection Interval: 330 ms
Zone Setting Detection Interval: 360 ms

## 2015-09-09 ENCOUNTER — Encounter: Payer: Self-pay | Admitting: Cardiology

## 2015-09-23 ENCOUNTER — Encounter: Payer: Self-pay | Admitting: Cardiology

## 2015-12-09 ENCOUNTER — Other Ambulatory Visit: Payer: Self-pay | Admitting: Cardiology

## 2015-12-09 NOTE — Telephone Encounter (Signed)
Rx request sent to pharmacy.  

## 2016-01-01 ENCOUNTER — Encounter: Payer: Self-pay | Admitting: Internal Medicine

## 2016-01-01 ENCOUNTER — Ambulatory Visit (INDEPENDENT_AMBULATORY_CARE_PROVIDER_SITE_OTHER): Payer: Medicare HMO | Admitting: Internal Medicine

## 2016-01-01 VITALS — BP 128/78 | HR 62 | Ht 68.0 in | Wt 154.4 lb

## 2016-01-01 DIAGNOSIS — Z95810 Presence of automatic (implantable) cardiac defibrillator: Secondary | ICD-10-CM

## 2016-01-01 DIAGNOSIS — I429 Cardiomyopathy, unspecified: Secondary | ICD-10-CM | POA: Diagnosis not present

## 2016-01-01 DIAGNOSIS — I5022 Chronic systolic (congestive) heart failure: Secondary | ICD-10-CM

## 2016-01-01 DIAGNOSIS — I428 Other cardiomyopathies: Secondary | ICD-10-CM

## 2016-01-01 NOTE — Patient Instructions (Addendum)
Medication Instructions: 1) Stop ramipril 2) After 36 hours off ramipril- start Entresto 49/51 mg one tablet by mouth twice daily   Labwork: - none  Procedures/Testing: - Your physician has recommended that you have a cardiopulmonary stress test (CPX)- in 4 weeks (but before 02/01/16). CPX testing is a non-invasive measurement of heart and lung function. It replaces a traditional treadmill stress test. This type of test provides a tremendous amount of information that relates not only to your present condition but also for future outcomes. This test combines measurements of you ventilation, respiratory gas exchange in the lungs, electrocardiogram (EKG), blood pressure and physical response before, during, and following an exercise protocol.   Follow-Up: - Remote monitoring is used to monitor your Pacemaker of ICD from home. This monitoring reduces the number of office visits required to check your device to one time per year. It allows [place] to keep an eye on the functioning of your device to ensure it is working properly. You are scheduled for a device check from home on 04/04/16. You may send your transmission at any time that day. If you have a wireless device, the transmission will be sent automatically. After your physician reviews your transmission, you will receive a postcard with your next transmission date.  - Your physician wants you to follow-up in: 1 year with Dr. Graciela Husbands. You will receive a reminder letter in the mail two months in advance. If you don't receive a letter, please call our office to schedule the follow-up appointment.  Any Additional Special Instructions Will Be Listed Below (If Applicable).     If you need a refill on your cardiac medications before your next appointment, please call your pharmacy.

## 2016-01-01 NOTE — Progress Notes (Signed)
Patient Care Team: Olive Bass, MD as PCP - General (Unknown Physician Specialty)   HPI  Robert Massey is a 55 y.o. male Seen in followup for nonischemic cardiomyopathy for which he is status post ICD implantation. 11/14 he underwent generator system replacement with a new lead to replace a 6949-lead and a new generator at [facility]   There has been interval improvement in his EF so that notwithstanding his LBBB he did not undergo CRT upgrade  (45%<<10%)  He saw Dr. Marsa Aris 8/16. Repeat echocardiogram demonstrated ejection fraction of 30-35%.  The patient denies chest pain,  , nocturnal dyspnea, orthopnea   There have been no palpitations, lightheadedness or syncope.   He has noted more DOE, but no edema   He also has problems with progressive fatigue. He has some snoring. He does not think he has sleep apnea. He does not sleep well.  Past Medical History  Diagnosis Date  . Hypertension   . Systolic CHF, chronic (HCC)   . LBBB (left bundle branch block)   . dilated cardiomyopathy   . ALCOHOL ABUSE 06/18/2009  . FATTY LIVER DISEASE 02/14/2008  . Sinus bradycardia   . ICD-Medtronic 11/08/2011    generator replacment 11/14  . 6949 lead removed from service 11/14     Past Surgical History  Procedure Laterality Date  . Cardiac catheterization  09/12/2002  . Implantable cardioverter defibrillator implant  2014    Medtronic Maximo 7232; gen change and RV lead revision 10-23-2013 by Dr Graciela Husbands  . Bi-ventricular implantable cardioverter defibrillator N/A 10/23/2013    Procedure: BI-VENTRICULAR IMPLANTABLE CARDIOVERTER DEFIBRILLATOR  (CRT-D);  Surgeon: Duke Salvia, MD;  Location: [facility] CATH LAB;  Service: Cardiovascular;  Laterality: N/A;    Current Outpatient Prescriptions  Medication Sig Dispense Refill  . furosemide (LASIX) 20 MG tablet Take 20 mg by mouth as needed for edema.    Marland Kitchen aspirin EC 81 MG tablet Take 1 tablet (81 mg total) by mouth daily. 30 tablet 11  . carvedilol  (COREG) 12.5 MG tablet TAKE 1 TABLET TWICE DAILY 180 tablet 0  . Multiple Vitamins-Minerals (MULTIVITAMIN WITH MINERALS) tablet Take 1 tablet by mouth daily.    . ramipril (ALTACE) 10 MG capsule TAKE 1 CAPSULE TWICE DAILY AT 12 NOON AND AT 4PM 180 capsule 1  . spironolactone (ALDACTONE) 25 MG tablet TAKE 1 TABLET EVERY DAY 90 tablet 0   No current facility-administered medications for this visit.    Allergies  Allergen Reactions  . Penicillins Itching    Review of Systems negative except from HPI and PMH  Physical Exam BP 128/78 mmHg  Pulse 62  Ht  (1.727 m)  Wt 154 lb 6.4 oz (70.035 kg)  BMI 23.48 kg/m2  SpO2 99% Well developed and well nourished in no acute distress HENT normal E scleral and icterus clear Neck Supple JVP flat; carotids brisk and full Clear to ausculation Device pocket well healed; without hematoma or erythema.  There is no tethering   egular rate and rhythm, no murmurs gallops or rub Soft with active bowel sounds No clubbing cyanosis none Edema Alert and oriented, grossly normal motor and sensory function Skin Warm and Dry   ECG demonstrates sinus rhythm at 62 Intervals 16/15/44 Axis -60 Left bundle branch block   Assessment and  Plan  Nonischemic cardiomyopathy  Continue current meds  check K today on aldactone  Have discussed with DR [facility] the role of entresto  Sinus bradycardia stable  ICD-primary prevention  No intercurrent Ventricular tachycardia The patient's device was interrogated.  The information was reviewed. No changes were made in the programming.    LBBB  CHF chronic systolic euvolemic    We had a lengthy discussion regarding progressive heart failure manifested by fatigue and shortness of breath in the context of worsening LV function I EF now 30-35%.  His left bundle branch block is a QRS duration of 152 ms. I think it is reasonable to consider CRT. We also discussed the potential benefits of Entresto as and alternative next  step. To that end we will discontinue his ramipril and put him on Entresto 49/51. We will also arrange for  CPX to try to clarify the limits of his exertion. In the event that he is cardiac, I will await his discussions with Dr. Jens Som cc and again 2/27.  RU/AP reminds me that Sherryll Burger was started 36 hours following discontinuation of his Ace.  More than 50% of 45 min was spent in counseling related to the above

## 2016-01-12 LAB — CUP PACEART INCLINIC DEVICE CHECK
Battery Remaining Longevity: 125 mo
Battery Voltage: 3 V
Date Time Interrogation Session: 20170127223110
HIGH POWER IMPEDANCE MEASURED VALUE: 77 Ohm
Lead Channel Impedance Value: 475 Ohm
Lead Channel Setting Pacing Pulse Width: 0.4 ms
Lead Channel Setting Sensing Sensitivity: 0.3 mV
MDC IDC LEAD IMPLANT DT: 20141119
MDC IDC LEAD LOCATION: 753860
MDC IDC MSMT LEADCHNL RV IMPEDANCE VALUE: 551 Ohm
MDC IDC MSMT LEADCHNL RV PACING THRESHOLD AMPLITUDE: 0.75 V
MDC IDC MSMT LEADCHNL RV PACING THRESHOLD PULSEWIDTH: 0.4 ms
MDC IDC MSMT LEADCHNL RV SENSING INTR AMPL: 6.875 mV
MDC IDC SET LEADCHNL RV PACING AMPLITUDE: 2.5 V
MDC IDC STAT BRADY RV PERCENT PACED: 0.01 %

## 2016-01-22 ENCOUNTER — Other Ambulatory Visit: Payer: Self-pay | Admitting: *Deleted

## 2016-01-22 MED ORDER — SACUBITRIL-VALSARTAN 49-51 MG PO TABS: 1.0000 | ORAL_TABLET | Freq: Two times a day (BID) | ORAL | Status: DC

## 2016-01-26 ENCOUNTER — Telehealth: Payer: Self-pay

## 2016-01-26 NOTE — Telephone Encounter (Signed)
Prior auth for Entresto 49-51 sent to [employer] 

## 2016-01-27 ENCOUNTER — Ambulatory Visit (HOSPITAL_COMMUNITY): Payer: Medicare HMO | Attending: Internal Medicine

## 2016-01-27 DIAGNOSIS — I5022 Chronic systolic (congestive) heart failure: Secondary | ICD-10-CM

## 2016-01-27 DIAGNOSIS — I428 Other cardiomyopathies: Secondary | ICD-10-CM | POA: Diagnosis not present

## 2016-02-01 NOTE — Progress Notes (Signed)
HPI: FU nonischemic cardiomyopathy and prior ICD. Prior cardiac catheterization in October of 2003 revealed an ejection fraction of 10% and normal coronary arteries. His most recent echocardiogram was performed in August 2016 and showed ejection fraction 30-35%, grade 1 diastolic dysfunction and trace mitral/tricuspid regurgitation. CPX February 2017 showed moderate functional impairment secondary to CHF. He is also being considered for upgrade to CRT. Since he was last seen, the patient has dyspnea with more extreme activities but not with routine activities. It is relieved with rest. It is not associated with chest pain. There is no orthopnea, PND or pedal edema. There is no syncope or palpitations. There is no exertional chest pain.   Current Outpatient Prescriptions  Medication Sig Dispense Refill  . aspirin EC 81 MG tablet Take 1 tablet (81 mg total) by mouth daily. 30 tablet 11  . carvedilol (COREG) 12.5 MG tablet TAKE 1 TABLET TWICE DAILY 180 tablet 0  . furosemide (LASIX) 20 MG tablet Take 20 mg by mouth as needed for edema.    . Multiple Vitamins-Minerals (MULTIVITAMIN WITH MINERALS) tablet Take 1 tablet by mouth daily.    . sacubitril-valsartan (ENTRESTO) 49-51 MG Take 1 tablet by mouth 2 (two) times daily. 180 tablet 3  . spironolactone (ALDACTONE) 25 MG tablet TAKE 1 TABLET EVERY DAY 90 tablet 0   No current facility-administered medications for this visit.     Past Medical History  Diagnosis Date  . Hypertension   . Systolic CHF, chronic (HCC)   . LBBB (left bundle branch block)   . dilated cardiomyopathy   . ALCOHOL ABUSE 06/18/2009  . FATTY LIVER DISEASE 02/14/2008  . Sinus bradycardia   . ICD-Medtronic 11/08/2011    generator replacment 11/14  . 6949 lead removed from service 11/14     Past Surgical History  Procedure Laterality Date  . Cardiac catheterization  09/12/2002  . Implantable cardioverter defibrillator implant  2014    Medtronic Maximo 7232; gen  change and RV lead revision 10-23-2013 by Dr Graciela Husbands  . Bi-ventricular implantable cardioverter defibrillator N/A 10/23/2013    Procedure: BI-VENTRICULAR IMPLANTABLE CARDIOVERTER DEFIBRILLATOR  (CRT-D);  Surgeon: Duke Salvia, MD;  Location: [facility] CATH LAB;  Service: Cardiovascular;  Laterality: N/A;    Social History   Social History  . Marital Status: Married    Spouse Name: N/A  . Number of Children: N/A  . Years of Education: N/A   Occupational History  . Not on file.   Social History Main Topics  . Smoking status: Never Smoker   . Smokeless tobacco: Never Used  . Alcohol Use: Yes     Comment: i HAVE NOT DRANK IN 2& HALF YEARS"  . Drug Use: No  . Sexual Activity: Not on file   Other Topics Concern  . Not on file   Social History Narrative    Family History  Problem Relation Age of Onset  . Heart Problems Mother   . Hypertension Mother   . Cancer - Lung Father   . Cancer - Other Father 76    liver,shoulder  . Hypertension Brother   . Other Son     good health  . Other Son     good health  . Other Daughter     good health    ROS: no fevers or chills, productive cough, hemoptysis, dysphasia, odynophagia, melena, hematochezia, dysuria, hematuria, rash, seizure activity, orthopnea, PND, pedal edema, claudication. Remaining systems are negative.  Physical Exam: Well-developed well-nourished in no  acute distress.  Skin is warm and dry.  HEENT is normal.  Neck is supple.  Chest is clear to auscultation with normal expansion.  Cardiovascular exam is regular rate and rhythm.  Abdominal exam nontender or distended. No masses palpated. Extremities show no edema. neuro grossly intact

## 2016-02-02 ENCOUNTER — Ambulatory Visit (INDEPENDENT_AMBULATORY_CARE_PROVIDER_SITE_OTHER): Payer: Medicare HMO | Admitting: Cardiology

## 2016-02-02 ENCOUNTER — Encounter: Payer: Self-pay | Admitting: Cardiology

## 2016-02-02 VITALS — BP 98/78 | HR 60 | Ht 68.0 in | Wt 151.6 lb

## 2016-02-02 DIAGNOSIS — I5022 Chronic systolic (congestive) heart failure: Secondary | ICD-10-CM | POA: Diagnosis not present

## 2016-02-02 DIAGNOSIS — I429 Cardiomyopathy, unspecified: Secondary | ICD-10-CM

## 2016-02-02 DIAGNOSIS — I10 Essential (primary) hypertension: Secondary | ICD-10-CM

## 2016-02-02 DIAGNOSIS — I428 Other cardiomyopathies: Secondary | ICD-10-CM

## 2016-02-02 LAB — BASIC METABOLIC PANEL
BUN: 9 mg/dL (ref 7–25)
CALCIUM: 9.5 mg/dL (ref 8.6–10.3)
CO2: 28 mmol/L (ref 20–31)
Chloride: 101 mmol/L (ref 98–110)
Creat: 0.82 mg/dL (ref 0.70–1.33)
GLUCOSE: 96 mg/dL (ref 65–99)
POTASSIUM: 4.6 mmol/L (ref 3.5–5.3)
SODIUM: 140 mmol/L (ref 135–146)

## 2016-02-02 NOTE — Patient Instructions (Signed)

## 2016-02-02 NOTE — Assessment & Plan Note (Signed)
Blood pressure controlled. Continue present medications. 

## 2016-02-02 NOTE — Assessment & Plan Note (Signed)
Management per electrophysiology. 

## 2016-02-02 NOTE — Assessment & Plan Note (Signed)
Continue beta blocker and entresto; check potassium and renal function.

## 2016-02-02 NOTE — Assessment & Plan Note (Addendum)
Euvolemic on examination. Continue present dose of diuretics. Check potassium and renal function. Patient symptoms are reasonably well controlled but his CPX did show functional limitation related to circulatory system. We discussed upgrade of his device to CRT. However he would like to continue medical therapy for now and reconsider in 6 months depending on his symptoms.

## 2016-02-03 ENCOUNTER — Telehealth: Payer: Self-pay

## 2016-02-03 NOTE — Telephone Encounter (Signed)
Approval of Entresto 49-51 from [place], but only through 04/25/2016.

## 2016-02-08 ENCOUNTER — Other Ambulatory Visit: Payer: Self-pay | Admitting: Cardiology

## 2016-02-08 NOTE — Telephone Encounter (Signed)
Rx(s) sent to pharmacy electronically.  

## 2016-02-17 ENCOUNTER — Other Ambulatory Visit: Payer: Self-pay

## 2016-02-17 MED ORDER — SACUBITRIL-VALSARTAN 49-51 MG PO TABS: 1.0000 | ORAL_TABLET | Freq: Two times a day (BID) | ORAL | Status: DC

## 2016-04-04 ENCOUNTER — Telehealth: Payer: Self-pay | Admitting: Cardiology

## 2016-04-04 ENCOUNTER — Ambulatory Visit (INDEPENDENT_AMBULATORY_CARE_PROVIDER_SITE_OTHER): Payer: Medicare HMO | Admitting: *Deleted

## 2016-04-04 DIAGNOSIS — I5022 Chronic systolic (congestive) heart failure: Secondary | ICD-10-CM

## 2016-04-04 DIAGNOSIS — I429 Cardiomyopathy, unspecified: Secondary | ICD-10-CM | POA: Diagnosis not present

## 2016-04-04 DIAGNOSIS — I428 Other cardiomyopathies: Secondary | ICD-10-CM

## 2016-04-04 NOTE — Telephone Encounter (Signed)
LMOVM reminding pt to send remote transmission.   

## 2016-04-05 NOTE — Progress Notes (Signed)
Remote ICD transmission.   

## 2016-05-13 ENCOUNTER — Encounter: Payer: Self-pay | Admitting: Cardiology

## 2016-05-15 LAB — CUP PACEART REMOTE DEVICE CHECK
HighPow Impedance: 79 Ohm
Implantable Lead Implant Date: 20141119
Implantable Lead Location: 753860
Lead Channel Pacing Threshold Amplitude: 0.5 V
Lead Channel Pacing Threshold Pulse Width: 0.4 ms
Lead Channel Sensing Intrinsic Amplitude: 6.5 mV
Lead Channel Setting Sensing Sensitivity: 0.3 mV
MDC IDC MSMT BATTERY REMAINING LONGEVITY: 123 mo
MDC IDC MSMT BATTERY VOLTAGE: 3 V
MDC IDC MSMT LEADCHNL RV IMPEDANCE VALUE: 532 Ohm
MDC IDC MSMT LEADCHNL RV IMPEDANCE VALUE: 589 Ohm
MDC IDC MSMT LEADCHNL RV SENSING INTR AMPL: 6.5 mV
MDC IDC SESS DTM: 20170502053823
MDC IDC SET LEADCHNL RV PACING AMPLITUDE: 2.5 V
MDC IDC SET LEADCHNL RV PACING PULSEWIDTH: 0.4 ms
MDC IDC STAT BRADY RV PERCENT PACED: 0.01 %

## 2016-07-05 ENCOUNTER — Ambulatory Visit (INDEPENDENT_AMBULATORY_CARE_PROVIDER_SITE_OTHER): Payer: Medicare HMO | Admitting: *Deleted

## 2016-07-05 DIAGNOSIS — I429 Cardiomyopathy, unspecified: Secondary | ICD-10-CM

## 2016-07-05 DIAGNOSIS — I5022 Chronic systolic (congestive) heart failure: Secondary | ICD-10-CM

## 2016-07-05 DIAGNOSIS — Z95810 Presence of automatic (implantable) cardiac defibrillator: Secondary | ICD-10-CM

## 2016-07-05 DIAGNOSIS — I428 Other cardiomyopathies: Secondary | ICD-10-CM

## 2016-07-05 NOTE — Progress Notes (Signed)
Remote ICD transmission.   

## 2016-07-12 ENCOUNTER — Encounter: Payer: Self-pay | Admitting: Cardiology

## 2016-07-19 LAB — CUP PACEART REMOTE DEVICE CHECK
Battery Remaining Longevity: 121 mo
Battery Voltage: 3 V
HIGH POWER IMPEDANCE MEASURED VALUE: 80 Ohm
Lead Channel Impedance Value: 513 Ohm
Lead Channel Impedance Value: 608 Ohm
Lead Channel Pacing Threshold Pulse Width: 0.4 ms
Lead Channel Sensing Intrinsic Amplitude: 5.5 mV
Lead Channel Setting Pacing Amplitude: 2.5 V
Lead Channel Setting Pacing Pulse Width: 0.4 ms
MDC IDC LEAD IMPLANT DT: 20141119
MDC IDC LEAD LOCATION: 753860
MDC IDC MSMT LEADCHNL RV PACING THRESHOLD AMPLITUDE: 0.625 V
MDC IDC MSMT LEADCHNL RV SENSING INTR AMPL: 5.5 mV
MDC IDC SESS DTM: 20170801063324
MDC IDC SET LEADCHNL RV SENSING SENSITIVITY: 0.3 mV
MDC IDC STAT BRADY RV PERCENT PACED: 0.01 %

## 2016-10-04 ENCOUNTER — Ambulatory Visit (INDEPENDENT_AMBULATORY_CARE_PROVIDER_SITE_OTHER): Payer: Medicare HMO | Admitting: *Deleted

## 2016-10-04 DIAGNOSIS — I428 Other cardiomyopathies: Secondary | ICD-10-CM | POA: Diagnosis not present

## 2016-10-04 DIAGNOSIS — I5022 Chronic systolic (congestive) heart failure: Secondary | ICD-10-CM

## 2016-10-05 NOTE — Progress Notes (Signed)
Remote ICD transmission.   

## 2016-10-12 ENCOUNTER — Encounter: Payer: Self-pay | Admitting: Cardiology

## 2016-10-26 ENCOUNTER — Encounter: Payer: Self-pay | Admitting: Cardiology

## 2016-11-03 LAB — CUP PACEART REMOTE DEVICE CHECK
Battery Remaining Longevity: 119 mo
Battery Voltage: 2.99 V
HighPow Impedance: 79 Ohm
Implantable Lead Implant Date: 20141119
Implantable Lead Location: 753860
Implantable Pulse Generator Implant Date: 20141119
Lead Channel Pacing Threshold Amplitude: 0.625 V
Lead Channel Pacing Threshold Pulse Width: 0.4 ms
Lead Channel Setting Pacing Amplitude: 2.5 V
Lead Channel Setting Pacing Pulse Width: 0.4 ms
Lead Channel Setting Sensing Sensitivity: 0.3 mV
MDC IDC MSMT LEADCHNL RV IMPEDANCE VALUE: 513 Ohm
MDC IDC MSMT LEADCHNL RV IMPEDANCE VALUE: 589 Ohm
MDC IDC MSMT LEADCHNL RV SENSING INTR AMPL: 6.625 mV
MDC IDC MSMT LEADCHNL RV SENSING INTR AMPL: 6.625 mV
MDC IDC SESS DTM: 20171031092607
MDC IDC STAT BRADY RV PERCENT PACED: 0.01 %

## 2017-02-16 ENCOUNTER — Encounter: Payer: Self-pay | Admitting: Cardiology

## 2017-04-06 ENCOUNTER — Other Ambulatory Visit: Payer: Self-pay | Admitting: Internal Medicine

## 2017-04-07 NOTE — Telephone Encounter (Signed)
This is Dr. Crenshaw pt 

## 2017-04-12 ENCOUNTER — Encounter: Payer: Self-pay | Admitting: Internal Medicine

## 2017-04-21 ENCOUNTER — Other Ambulatory Visit: Payer: Self-pay | Admitting: Cardiology

## 2017-05-04 ENCOUNTER — Encounter: Payer: Self-pay | Admitting: Internal Medicine

## 2017-05-04 ENCOUNTER — Ambulatory Visit (INDEPENDENT_AMBULATORY_CARE_PROVIDER_SITE_OTHER): Payer: Medicare HMO | Admitting: Internal Medicine

## 2017-05-04 VITALS — BP 122/84 | HR 65 | Ht 68.0 in | Wt 156.0 lb

## 2017-05-04 DIAGNOSIS — I447 Left bundle-branch block, unspecified: Secondary | ICD-10-CM

## 2017-05-04 DIAGNOSIS — Z95810 Presence of automatic (implantable) cardiac defibrillator: Secondary | ICD-10-CM

## 2017-05-04 DIAGNOSIS — I5022 Chronic systolic (congestive) heart failure: Secondary | ICD-10-CM

## 2017-05-04 DIAGNOSIS — I428 Other cardiomyopathies: Secondary | ICD-10-CM

## 2017-05-04 LAB — CUP PACEART INCLINIC DEVICE CHECK
Battery Remaining Longevity: 35 mo
Brady Statistic AP VP Percent: 3 %
Implantable Lead Implant Date: 20141119
Implantable Pulse Generator Implant Date: 20141119
Lead Channel Impedance Value: 1250 Ohm
Lead Channel Impedance Value: 2168 Ohm
Lead Channel Pacing Threshold Amplitude: 1 V
Lead Channel Pacing Threshold Amplitude: 1.25 V
Lead Channel Pacing Threshold Pulse Width: 0.4 ms
Lead Channel Pacing Threshold Pulse Width: 0.4 ms
Lead Channel Setting Sensing Sensitivity: 4 mV
MDC IDC LEAD LOCATION: 753860
MDC IDC MSMT BATTERY IMPEDANCE: 2064 Ohm
MDC IDC MSMT BATTERY VOLTAGE: 2.76 V
MDC IDC MSMT LEADCHNL RA PACING THRESHOLD AMPLITUDE: 0.875 V
MDC IDC MSMT LEADCHNL RA PACING THRESHOLD PULSEWIDTH: 0.4 ms
MDC IDC MSMT LEADCHNL RA SENSING INTR AMPL: 2 mV
MDC IDC MSMT LEADCHNL RV PACING THRESHOLD AMPLITUDE: 1.25 V
MDC IDC MSMT LEADCHNL RV PACING THRESHOLD PULSEWIDTH: 0.4 ms
MDC IDC MSMT LEADCHNL RV SENSING INTR AMPL: 11.2 mV
MDC IDC SESS DTM: 20180531150704
MDC IDC SET LEADCHNL RA PACING AMPLITUDE: 2 V
MDC IDC SET LEADCHNL RV PACING AMPLITUDE: 2.5 V
MDC IDC SET LEADCHNL RV PACING PULSEWIDTH: 0.4 ms
MDC IDC STAT BRADY AP VS PERCENT: 3 %
MDC IDC STAT BRADY AS VP PERCENT: 1 %
MDC IDC STAT BRADY AS VS PERCENT: 92 %

## 2017-05-04 MED ORDER — SACUBITRIL-VALSARTAN 49-51 MG PO TABS: 1.0000 | ORAL_TABLET | Freq: Two times a day (BID) | ORAL | 3 refills | Status: DC

## 2017-05-04 NOTE — Progress Notes (Signed)
Patient Care Team: Olive Bass, MD as PCP - General (Unknown Physician Specialty)   HPI  Robert Massey is a 56 y.o. male Seen in followup for nonischemic cardiomyopathy for which he is status post ICD implantation. 11/14 he underwent generator system replacement with a new lead to replace a 6949-lead and a new generator at [facility]   There has been interval improvement in his EF so that notwithstanding his LBBB he did not undergo CRT upgrade  (45%<<10%)  He saw Dr. Marsa Aris 8/16. Repeat echocardiogram demonstrated ejection fraction of 30-35%. Anticipated follow-up 6 months after his appointment 2/17 with Dr. Marsa Aris never happened.  Cardiopulmonary stress test 2/17 had demonstrated a moderately limited exercise capacity.  He is able to do his stated days without chest pain or shortness of breath. He has not had peripheral edema.  He struggles with depression. This is accompanied by irritability  Date Cr K Mg  2/17  0.82 4.6            Past Medical History:  Diagnosis Date  . [phone] lead removed from service 11/14   . ALCOHOL ABUSE 06/18/2009  . dilated cardiomyopathy   . FATTY LIVER DISEASE 02/14/2008  . Hypertension   . ICD-Medtronic 11/08/2011   generator replacment 11/14  . LBBB (left bundle branch block)   . Sinus bradycardia   . Systolic CHF, chronic (HCC)     Past Surgical History:  Procedure Laterality Date  . BI-VENTRICULAR IMPLANTABLE CARDIOVERTER DEFIBRILLATOR N/A 10/23/2013   Procedure: BI-VENTRICULAR IMPLANTABLE CARDIOVERTER DEFIBRILLATOR  (CRT-D);  Surgeon: Duke Salvia, MD;  Location: [facility] CATH LAB;  Service: Cardiovascular;  Laterality: N/A;  . CARDIAC CATHETERIZATION  09/12/2002  . IMPLANTABLE CARDIOVERTER DEFIBRILLATOR IMPLANT  2014   Medtronic Maximo 7232; gen change and RV lead revision 10-23-2013 by Dr Graciela Husbands    Current Outpatient Prescriptions  Medication Sig Dispense Refill  . aspirin EC 81 MG tablet Take 1 tablet (81 mg total) by mouth daily. 30  tablet 11  . carvedilol (COREG) 12.5 MG tablet Take 1 tablet (12.5 mg total) by mouth 2 (two) times daily. 180 tablet 3  . furosemide (LASIX) 20 MG tablet Take 20 mg by mouth as needed for edema.    . Multiple Vitamins-Minerals (MULTIVITAMIN WITH MINERALS) tablet Take 1 tablet by mouth daily.    . sacubitril-valsartan (ENTRESTO) 49-51 MG Take 1 tablet by mouth 2 (two) times daily. 180 tablet 3  . spironolactone (ALDACTONE) 25 MG tablet Take 1 tablet (25 mg total) by mouth daily. Patient needs appointment for refills 30 tablet 0   No current facility-administered medications for this visit.     Allergies  Allergen Reactions  . Penicillins Itching    Review of Systems negative except from HPI and PMH  Physical Exam BP 122/84   Pulse 65   Ht 5\' 8"  (1.727 m)   Wt 156 lb (70.8 kg)   SpO2 99%   BMI 23.72 kg/m  Well developed and nourished in no acute distress HENT normal Neck supple with JVP-flat Clear Regular rate and rhythm, no murmurs or gallops Abd-soft with active BS No Clubbing cyanosis edema Skin-warm and dry A & Oriented  Grossly normal sensory and motor function Affect flat    ECG demonstrates sinus rhythm at 63  16/15/45  Axis -32  Left bundle branch block   Assessment and  Plan  Nonischemic cardiomyopathy    Sinus bradycardia    ICD-primary prevention     LBBB  CHF chronic systolic    Depression   . In the event that it remains poor following maximization of his guidelines directed therapy, with his left bundle branch block he will be a candidate for CRT upgrade. We did not broach this today.  Beta blocker up titration is limited by his heart rate excursion  Euvolemic continue current meds  We spoke at some length about the impact of his depression and irritability on his own health and out of his family. He is encouraged to pursue further help in this regard.   More than 50% of 40 min was spent in counseling related to the above

## 2017-05-04 NOTE — Patient Instructions (Signed)
Medication Instructions: - Your physician recommends that you continue on your current medications as directed. Please refer to the Current Medication list given to you today.  Labwork: - none ordered  Procedures/Testing: - none ordered  Follow-Up: - Remote monitoring is used to monitor your Pacemaker of ICD from home. This monitoring reduces the number of office visits required to check your device to one time per year. It allows [place] to keep an eye on the functioning of your device to ensure it is working properly. You are scheduled for a device check from home on 08/03/17. You may send your transmission at any time that day. If you have a wireless device, the transmission will be sent automatically. After your physician reviews your transmission, you will receive a postcard with your next transmission date.  - Your physician wants you to follow-up in: 6 months with Dr. Jens Som & 1 year with Francis Dowse, PA for Dr. Graciela Husbands. You will receive a reminder letter in the mail two months in advance. If you don't receive a letter, please call our office to schedule the follow-up appointment.   Any Additional Special Instructions Will Be Listed Below (If Applicable).     If you need a refill on your cardiac medications before your next appointment, please call your pharmacy.

## 2017-06-14 ENCOUNTER — Telehealth: Payer: Self-pay | Admitting: Cardiology

## 2017-06-14 NOTE — Telephone Encounter (Signed)
Reviewed echo EF findings, & faxed last OV notes to Dr. Ebbie Ridge at request. No further needs.

## 2017-07-19 ENCOUNTER — Other Ambulatory Visit: Payer: Self-pay | Admitting: Cardiology

## 2017-08-03 ENCOUNTER — Ambulatory Visit (INDEPENDENT_AMBULATORY_CARE_PROVIDER_SITE_OTHER): Payer: Medicare HMO | Admitting: *Deleted

## 2017-08-03 DIAGNOSIS — I428 Other cardiomyopathies: Secondary | ICD-10-CM

## 2017-08-03 NOTE — Progress Notes (Signed)
Remote ICD transmission.   

## 2017-08-14 ENCOUNTER — Other Ambulatory Visit: Payer: Self-pay | Admitting: Cardiology

## 2017-08-15 ENCOUNTER — Encounter: Payer: Self-pay | Admitting: Cardiology

## 2017-08-15 NOTE — Telephone Encounter (Signed)
REFILL 

## 2017-08-28 LAB — CUP PACEART REMOTE DEVICE CHECK
Implantable Lead Implant Date: 20141119
Implantable Lead Location: 753860
Implantable Pulse Generator Implant Date: 20141119
MDC IDC SESS DTM: 20180924144005
MDC IDC SET LEADCHNL RV PACING AMPLITUDE: 2.5 V
MDC IDC SET LEADCHNL RV PACING PULSEWIDTH: 0.4 ms
MDC IDC SET LEADCHNL RV SENSING SENSITIVITY: 0.3 mV

## 2017-10-23 ENCOUNTER — Telehealth: Payer: Self-pay | Admitting: Cardiology

## 2017-10-23 MED ORDER — CARVEDILOL 12.5 MG PO TABS: 12.5000 mg | ORAL_TABLET | Freq: Two times a day (BID) | ORAL | 0 refills | Status: DC

## 2017-10-23 NOTE — Telephone Encounter (Signed)
Pt called to make appt 11-13-17, past due and needs refill of Coreg 12.5 mg, pt out and needs called into [facility] in [place]

## 2017-10-30 NOTE — Progress Notes (Deleted)
HPI: FU nonischemic cardiomyopathy and prior ICD. Prior cardiac catheterization in October of 2003 revealed an ejection fraction of 10% and normal coronary arteries. His most recent echocardiogram was performed in August 2016 and showed ejection fraction 30-35%, grade 1 diastolic dysfunction and trace mitral/tricuspid regurgitation. CPX February 2017 showed moderate functional impairment secondary to CHF. He is also being considered for upgrade to CRT. Since he was last seen,   Current Outpatient Medications  Medication Sig Dispense Refill  . aspirin EC 81 MG tablet Take 1 tablet (81 mg total) by mouth daily. 30 tablet 11  . carvedilol (COREG) 12.5 MG tablet Take 1 tablet (12.5 mg total) 2 (two) times daily by mouth. 30 tablet 0  . furosemide (LASIX) 20 MG tablet Take 20 mg by mouth as needed for edema.    . Multiple Vitamins-Minerals (MULTIVITAMIN WITH MINERALS) tablet Take 1 tablet by mouth daily.    . sacubitril-valsartan (ENTRESTO) 49-51 MG Take 1 tablet by mouth 2 (two) times daily. 180 tablet 3  . spironolactone (ALDACTONE) 25 MG tablet Take 1 tablet (25 mg total) by mouth daily. 30 tablet 7   No current facility-administered medications for this visit.      Past Medical History:  Diagnosis Date  . [phone] lead removed from service 11/14   . ALCOHOL ABUSE 06/18/2009  . dilated cardiomyopathy   . FATTY LIVER DISEASE 02/14/2008  . Hypertension   . ICD-Medtronic 11/08/2011   generator replacment 11/14  . LBBB (left bundle branch block)   . Sinus bradycardia   . Systolic CHF, chronic (HCC)     Past Surgical History:  Procedure Laterality Date  . BI-VENTRICULAR IMPLANTABLE CARDIOVERTER DEFIBRILLATOR N/A 10/23/2013   Procedure: BI-VENTRICULAR IMPLANTABLE CARDIOVERTER DEFIBRILLATOR  (CRT-D);  Surgeon: Duke Salvia, MD;  Location: [facility] CATH LAB;  Service: Cardiovascular;  Laterality: N/A;  . CARDIAC CATHETERIZATION  09/12/2002  . IMPLANTABLE CARDIOVERTER DEFIBRILLATOR IMPLANT   2014   Medtronic Maximo 7232; gen change and RV lead revision 10-23-2013 by Dr Graciela Husbands    Social History   Socioeconomic History  . Marital status: Married    Spouse name: Not on file  . Number of children: Not on file  . Years of education: Not on file  . Highest education level: Not on file  Social Needs  . Financial resource strain: Not on file  . Food insecurity - worry: Not on file  . Food insecurity - inability: Not on file  . Transportation needs - medical: Not on file  . Transportation needs - non-medical: Not on file  Occupational History  . Not on file  Tobacco Use  . Smoking status: Never Smoker  . Smokeless tobacco: Never Used  Substance and Sexual Activity  . Alcohol use: Yes    Comment: i HAVE NOT DRANK IN 2& HALF YEARS"  . Drug use: No  . Sexual activity: Not on file  Other Topics Concern  . Not on file  Social History Narrative  . Not on file    Family History  Problem Relation Age of Onset  . Heart Problems Mother   . Hypertension Mother   . Cancer - Lung Father   . Cancer - Other Father 43       liver,shoulder  . Hypertension Brother   . Other Son        good health  . Other Son        good health  . Other Daughter  good health    ROS: no fevers or chills, productive cough, hemoptysis, dysphasia, odynophagia, melena, hematochezia, dysuria, hematuria, rash, seizure activity, orthopnea, PND, pedal edema, claudication. Remaining systems are negative.  Physical Exam: Well-developed well-nourished in no acute distress.  Skin is warm and dry.  HEENT is normal.  Neck is supple.  Chest is clear to auscultation with normal expansion.  Cardiovascular exam is regular rate and rhythm.  Abdominal exam nontender or distended. No masses palpated. Extremities show no edema. neuro grossly intact  ECG- personally reviewed  A/P  1  Olga MillersBrian Crenshaw, MD

## 2017-11-02 ENCOUNTER — Ambulatory Visit (INDEPENDENT_AMBULATORY_CARE_PROVIDER_SITE_OTHER): Payer: Medicare HMO | Admitting: *Deleted

## 2017-11-02 DIAGNOSIS — I428 Other cardiomyopathies: Secondary | ICD-10-CM | POA: Diagnosis not present

## 2017-11-02 NOTE — Progress Notes (Signed)
Remote ICD transmission.   

## 2017-11-03 ENCOUNTER — Encounter: Payer: Self-pay | Admitting: Cardiology

## 2017-11-10 ENCOUNTER — Other Ambulatory Visit: Payer: Self-pay | Admitting: *Deleted

## 2017-11-10 MED ORDER — SPIRONOLACTONE 25 MG PO TABS: 25.0000 mg | ORAL_TABLET | Freq: Every day | ORAL | 6 refills | Status: DC

## 2017-11-10 MED ORDER — CARVEDILOL 12.5 MG PO TABS: 12.5000 mg | ORAL_TABLET | Freq: Two times a day (BID) | ORAL | 6 refills | Status: DC

## 2017-11-13 ENCOUNTER — Ambulatory Visit: Payer: Medicare HMO | Admitting: Cardiology

## 2017-11-14 LAB — CUP PACEART REMOTE DEVICE CHECK
Date Time Interrogation Session: 20181211132709
HIGH POWER IMPEDANCE MEASURED VALUE: 82 Ohm
Implantable Lead Location: 753860
Implantable Pulse Generator Implant Date: 20141119
Lead Channel Impedance Value: 532 Ohm
Lead Channel Pacing Threshold Pulse Width: 0.4 ms
Lead Channel Sensing Intrinsic Amplitude: 5.1 mV
Lead Channel Setting Pacing Amplitude: 2.5 V
Lead Channel Setting Sensing Sensitivity: 0.3 mV
MDC IDC LEAD IMPLANT DT: 20141119
MDC IDC MSMT BATTERY REMAINING LONGEVITY: 102 mo
MDC IDC MSMT LEADCHNL RV PACING THRESHOLD AMPLITUDE: 0.625 V
MDC IDC SET LEADCHNL RV PACING PULSEWIDTH: 0.4 ms

## 2017-12-12 NOTE — Progress Notes (Signed)
HPI: FU nonischemic cardiomyopathy and prior ICD. Prior cardiac catheterization in October of 2003 revealed an ejection fraction of 10% and normal coronary arteries. His most recent echocardiogram was performed in August 2016 and showed ejection fraction 30-35%, grade 1 diastolic dysfunction and trace mitral/tricuspid regurgitation. CPX February 2017 showed moderate functional impairment secondary to CHF. He is also being considered for upgrade to CRT. Since he was last seen, the patient has dyspnea with more extreme activities but not with routine activities. It is relieved with rest. It is not associated with chest pain. There is no orthopnea, PND or pedal edema. There is no syncope or palpitations. There is no exertional chest pain.   Current Outpatient Medications  Medication Sig Dispense Refill  . aspirin EC 81 MG tablet Take 1 tablet (81 mg total) by mouth daily. 30 tablet 11  . carvedilol (COREG) 12.5 MG tablet Take 1 tablet (12.5 mg total) by mouth 2 (two) times daily. 30 tablet 6  . furosemide (LASIX) 20 MG tablet Take 20 mg by mouth as needed for edema.    . Multiple Vitamins-Minerals (MULTIVITAMIN WITH MINERALS) tablet Take 1 tablet by mouth daily.    . sacubitril-valsartan (ENTRESTO) 49-51 MG Take 1 tablet by mouth 2 (two) times daily. 180 tablet 3  . spironolactone (ALDACTONE) 25 MG tablet Take 1 tablet (25 mg total) by mouth daily. 30 tablet 6   No current facility-administered medications for this visit.      Past Medical History:  Diagnosis Date  . [phone] lead removed from service 11/14   . ALCOHOL ABUSE 06/18/2009  . dilated cardiomyopathy   . FATTY LIVER DISEASE 02/14/2008  . Hypertension   . ICD-Medtronic 11/08/2011   generator replacment 11/14  . LBBB (left bundle branch block)   . Sinus bradycardia   . Systolic CHF, chronic (HCC)     Past Surgical History:  Procedure Laterality Date  . BI-VENTRICULAR IMPLANTABLE CARDIOVERTER DEFIBRILLATOR N/A 10/23/2013   Procedure: BI-VENTRICULAR IMPLANTABLE CARDIOVERTER DEFIBRILLATOR  (CRT-D);  Surgeon: Duke Salvia, MD;  Location: [facility] CATH LAB;  Service: Cardiovascular;  Laterality: N/A;  . CARDIAC CATHETERIZATION  09/12/2002  . IMPLANTABLE CARDIOVERTER DEFIBRILLATOR IMPLANT  2014   Medtronic Maximo 7232; gen change and RV lead revision 10-23-2013 by Dr Graciela Husbands    Social History   Socioeconomic History  . Marital status: Married    Spouse name: Not on file  . Number of children: Not on file  . Years of education: Not on file  . Highest education level: Not on file  Social Needs  . Financial resource strain: Not on file  . Food insecurity - worry: Not on file  . Food insecurity - inability: Not on file  . Transportation needs - medical: Not on file  . Transportation needs - non-medical: Not on file  Occupational History  . Not on file  Tobacco Use  . Smoking status: Never Smoker  . Smokeless tobacco: Never Used  Substance and Sexual Activity  . Alcohol use: Yes    Comment: i HAVE NOT DRANK IN 2& HALF YEARS"  . Drug use: No  . Sexual activity: Not on file  Other Topics Concern  . Not on file  Social History Narrative  . Not on file    Family History  Problem Relation Age of Onset  . Heart Problems Mother   . Hypertension Mother   . Cancer - Lung Father   . Cancer - Other Father 37  liver,shoulder  . Hypertension Brother   . Other Son        good health  . Other Son        good health  . Other Daughter        good health    ROS: no fevers or chills, productive cough, hemoptysis, dysphasia, odynophagia, melena, hematochezia, dysuria, hematuria, rash, seizure activity, orthopnea, PND, pedal edema, claudication. Remaining systems are negative.  Physical Exam: Well-developed well-nourished in no acute distress.  Skin is warm and dry.  HEENT is normal.  Neck is supple.  Chest is clear to auscultation with normal expansion.  Cardiovascular exam is regular rate and rhythm.    Abdominal exam nontender or distended. No masses palpated. Extremities show no edema. neuro grossly intact   A/P  1 nonischemic cardiomyopathy-continue beta blocker and Entresto. Check potassium and renal function. Repeat echocardiogram.  2 hypertension-left pressure is controlled.Continue present medications.  3 chronic systolic congestive heart failure-patient appears to be euvolemic. Continue present dose of diuretic. Check potassium and renal function. He may need upgrade of his device to CRT in the future. Followed by Dr. Graciela Husbands.  4 prior ICD-managed by Dr. Graciela Husbands.  Olga Millers, MD

## 2017-12-18 ENCOUNTER — Encounter: Payer: Self-pay | Admitting: Cardiology

## 2017-12-18 ENCOUNTER — Ambulatory Visit: Payer: Medicare HMO | Admitting: Cardiology

## 2017-12-18 VITALS — BP 122/82 | HR 60 | Ht 68.0 in | Wt 161.4 lb

## 2017-12-18 DIAGNOSIS — I5022 Chronic systolic (congestive) heart failure: Secondary | ICD-10-CM

## 2017-12-18 DIAGNOSIS — I10 Essential (primary) hypertension: Secondary | ICD-10-CM | POA: Diagnosis not present

## 2017-12-18 DIAGNOSIS — I428 Other cardiomyopathies: Secondary | ICD-10-CM | POA: Diagnosis not present

## 2017-12-18 LAB — BASIC METABOLIC PANEL
BUN/Creatinine Ratio: 15 (ref 9–20)
BUN: 13 mg/dL (ref 6–24)
CHLORIDE: 104 mmol/L (ref 96–106)
CO2: 27 mmol/L (ref 20–29)
CREATININE: 0.85 mg/dL (ref 0.76–1.27)
Calcium: 9.4 mg/dL (ref 8.7–10.2)
GFR calc Af Amer: 114 mL/min/{1.73_m2} (ref >59)
GFR calc non Af Amer: 99 mL/min/{1.73_m2} (ref >59)
GLUCOSE: 78 mg/dL (ref 65–99)
Potassium: 4.7 mmol/L (ref 3.5–5.2)
SODIUM: 144 mmol/L (ref 134–144)

## 2017-12-18 MED ORDER — SPIRONOLACTONE 25 MG PO TABS: 25.0000 mg | ORAL_TABLET | Freq: Every day | ORAL | 3 refills | Status: DC

## 2017-12-18 MED ORDER — CARVEDILOL 12.5 MG PO TABS: 12.5000 mg | ORAL_TABLET | Freq: Two times a day (BID) | ORAL | 3 refills | Status: DC

## 2017-12-18 MED ORDER — FUROSEMIDE 20 MG PO TABS: 20.0000 mg | ORAL_TABLET | ORAL | 3 refills | Status: DC | PRN

## 2017-12-18 MED ORDER — SACUBITRIL-VALSARTAN 49-51 MG PO TABS: 1.0000 | ORAL_TABLET | Freq: Two times a day (BID) | ORAL | 3 refills | Status: DC

## 2017-12-18 NOTE — Patient Instructions (Signed)
Medication Instructions:   Refill sent to the pharmacy electronically.   Labwork:  Your physician recommends that you HAVE LAB WORK TODAY  Testing/Procedures:  Your physician has requested that you have an echocardiogram. Echocardiography is a painless test that uses sound waves to create images of your heart. It provides your doctor with information about the size and shape of your heart and how well your heart's chambers and valves are working. This procedure takes approximately one hour. There are no restrictions for this procedure.    Follow-Up:  Your physician wants you to follow-up in: ONE YEAR WITH DR Shelda Pal will receive a reminder letter in the mail two months in advance. If you don't receive a letter, please call our office to schedule the follow-up appointment.   If you need a refill on your cardiac medications before your next appointment, please call your pharmacy.

## 2017-12-20 ENCOUNTER — Other Ambulatory Visit: Payer: Self-pay | Admitting: *Deleted

## 2017-12-20 DIAGNOSIS — I428 Other cardiomyopathies: Secondary | ICD-10-CM

## 2017-12-27 ENCOUNTER — Ambulatory Visit (HOSPITAL_COMMUNITY): Payer: Medicare HMO | Attending: Cardiovascular Disease

## 2017-12-27 ENCOUNTER — Other Ambulatory Visit: Payer: Self-pay

## 2017-12-27 DIAGNOSIS — Z95810 Presence of automatic (implantable) cardiac defibrillator: Secondary | ICD-10-CM | POA: Insufficient documentation

## 2017-12-27 DIAGNOSIS — I428 Other cardiomyopathies: Secondary | ICD-10-CM | POA: Insufficient documentation

## 2017-12-27 DIAGNOSIS — I509 Heart failure, unspecified: Secondary | ICD-10-CM | POA: Diagnosis not present

## 2017-12-27 DIAGNOSIS — I110 Hypertensive heart disease with heart failure: Secondary | ICD-10-CM | POA: Diagnosis not present

## 2017-12-27 DIAGNOSIS — I447 Left bundle-branch block, unspecified: Secondary | ICD-10-CM | POA: Insufficient documentation

## 2018-01-16 ENCOUNTER — Telehealth: Payer: Self-pay | Admitting: *Deleted

## 2018-01-16 NOTE — Telephone Encounter (Signed)
Prior Authorization for [employer] submitted via [email or web address]. Awaiting determination. [id number]

## 2018-01-18 NOTE — Telephone Encounter (Signed)
Received fax, entresto approved through 01/16/2020.

## 2018-02-01 ENCOUNTER — Ambulatory Visit (INDEPENDENT_AMBULATORY_CARE_PROVIDER_SITE_OTHER): Payer: Medicare HMO | Admitting: *Deleted

## 2018-02-01 DIAGNOSIS — I428 Other cardiomyopathies: Secondary | ICD-10-CM

## 2018-02-01 NOTE — Progress Notes (Signed)
Remote ICD transmission.   

## 2018-02-02 ENCOUNTER — Encounter: Payer: Self-pay | Admitting: Cardiology

## 2018-02-20 LAB — CUP PACEART REMOTE DEVICE CHECK
Date Time Interrogation Session: 20190319083348
Implantable Lead Implant Date: 20141119
Implantable Pulse Generator Implant Date: 20141119
Lead Channel Setting Pacing Amplitude: 2.5 V
Lead Channel Setting Pacing Pulse Width: 0.4 ms
Lead Channel Setting Sensing Sensitivity: 0.3 mV
MDC IDC LEAD LOCATION: 753860

## 2018-05-03 ENCOUNTER — Ambulatory Visit (INDEPENDENT_AMBULATORY_CARE_PROVIDER_SITE_OTHER): Payer: Medicare HMO | Admitting: *Deleted

## 2018-05-03 DIAGNOSIS — I428 Other cardiomyopathies: Secondary | ICD-10-CM | POA: Diagnosis not present

## 2018-05-03 NOTE — Progress Notes (Signed)
Remote ICD transmission.   

## 2018-05-07 ENCOUNTER — Encounter: Payer: Self-pay | Admitting: Cardiology

## 2018-05-08 LAB — CUP PACEART REMOTE DEVICE CHECK
Implantable Lead Implant Date: 20141119
Implantable Lead Location: 753860
Lead Channel Setting Pacing Amplitude: 2.5 V
Lead Channel Setting Pacing Pulse Width: 0.4 ms
Lead Channel Setting Sensing Sensitivity: 0.3 mV
MDC IDC PG IMPLANT DT: 20141119
MDC IDC SESS DTM: 20190604111449

## 2018-08-02 ENCOUNTER — Ambulatory Visit (INDEPENDENT_AMBULATORY_CARE_PROVIDER_SITE_OTHER): Payer: Medicare HMO | Admitting: *Deleted

## 2018-08-02 DIAGNOSIS — I428 Other cardiomyopathies: Secondary | ICD-10-CM

## 2018-08-03 NOTE — Progress Notes (Signed)
Remote ICD transmission.   

## 2018-08-24 LAB — CUP PACEART REMOTE DEVICE CHECK
Implantable Lead Implant Date: 20141119
Implantable Lead Location: 753860
Implantable Pulse Generator Implant Date: 20141119
MDC IDC SESS DTM: 20190920083215

## 2018-11-07 ENCOUNTER — Ambulatory Visit (INDEPENDENT_AMBULATORY_CARE_PROVIDER_SITE_OTHER): Payer: Medicare HMO

## 2018-11-07 DIAGNOSIS — I428 Other cardiomyopathies: Secondary | ICD-10-CM | POA: Diagnosis not present

## 2018-11-07 NOTE — Progress Notes (Signed)
Remote ICD transmission.   

## 2018-11-13 ENCOUNTER — Encounter: Payer: Self-pay | Admitting: Cardiology

## 2018-12-07 NOTE — Progress Notes (Signed)
Cardiology Office Note Date:  12/11/2018  Patient ID:  Robert Massey, Robert Massey [birth year], MRN [id number] PCP:  Olive Bass, MD  Cardiologist:  Dr. Jens Som Electrophysiologist; Dr. Graciela Husbands   Chief Complaint: over due EP viist  History of Present Illness: Robert Massey is a 58 y.o. male with history of NICM, chronic CHF (systolic), HTN, h/o ETOH abuse, LBBB.  He comes in today to be seen for Dr. Graciela Husbands.  He saw him last in 2018.  His most recent visit was with Dr. Jens Som in Jan 2019.  He was doing well, with DOE only with heavy exertion, doing well, no changes were made with plans to update labs. AT his visit with Dr. Graciela Husbands, May 2018, he discussed his LVEF improved to 45% at the time of his gen change so no CRT upgrade was done.  Since last being seen in clinic, he reports doing reasonably well. He denies chest pain, shortness of breath (above baseline), LE edema, dizziness, syncope. He has not had ICD shocks. His dogs have been sick recently which has caused increased stress.   Device information MDT single chamber ICD, gen change and new RV lead implanted 10/23/13 Original implant was with with 6949 lead was 06/16/2004   Past Medical History:  Diagnosis Date  . [phone] lead removed from service 11/14   . ALCOHOL ABUSE 06/18/2009  . dilated cardiomyopathy   . FATTY LIVER DISEASE 02/14/2008  . Hypertension   . ICD-Medtronic 11/08/2011   generator replacment 11/14  . LBBB (left bundle branch block)   . Sinus bradycardia   . Systolic CHF, chronic (HCC)     Past Surgical History:  Procedure Laterality Date  . BI-VENTRICULAR IMPLANTABLE CARDIOVERTER DEFIBRILLATOR N/A 10/23/2013   Procedure: BI-VENTRICULAR IMPLANTABLE CARDIOVERTER DEFIBRILLATOR  (CRT-D);  Surgeon: Duke Salvia, MD;  Location: [facility] CATH LAB;  Service: Cardiovascular;  Laterality: N/A;  . CARDIAC CATHETERIZATION  09/12/2002  . IMPLANTABLE CARDIOVERTER DEFIBRILLATOR IMPLANT  2014   Medtronic Maximo 7232; gen  change and RV lead revision 10-23-2013 by Dr Graciela Husbands    Current Outpatient Medications  Medication Sig Dispense Refill  . aspirin EC 81 MG tablet Take 1 tablet (81 mg total) by mouth daily. 30 tablet 11  . carvedilol (COREG) 12.5 MG tablet Take 1 tablet (12.5 mg total) by mouth 2 (two) times daily. 180 tablet 3  . furosemide (LASIX) 20 MG tablet Take 1 tablet (20 mg total) by mouth as needed for edema. 90 tablet 3  . Multiple Vitamins-Minerals (MULTIVITAMIN WITH MINERALS) tablet Take 1 tablet by mouth daily.    . sacubitril-valsartan (ENTRESTO) 49-51 MG Take 1 tablet by mouth 2 (two) times daily. 180 tablet 3  . spironolactone (ALDACTONE) 25 MG tablet Take 1 tablet (25 mg total) by mouth daily. 90 tablet 3   No current facility-administered medications for this visit.     Allergies:   Penicillins   Social History:  The patient  reports that he has never smoked. He has never used smokeless tobacco. He reports current alcohol use. He reports that he does not use drugs.   Family History:  The patient's family history includes Cancer - Lung in his father; Cancer - Other (age of onset: 15) in his father; Heart Problems in his mother; Hypertension in his brother and mother; Other in his daughter, son, and son.  ROS:  Please see the history of present illness.    All other systems are reviewed and otherwise negative.   PHYSICAL EXAM:  VS:  BP 130/76   Pulse 85   Ht 5\' 8"  (1.727 m)   Wt 161 lb (73 kg)   SpO2 98%   BMI 24.48 kg/m  BMI: Body mass index is 24.48 kg/m. Well nourished, well developed, in no acute distress  HEENT: normocephalic, atraumatic  Neck: no JVD, carotid bruits or masses Cardiac:   RRR; no significant murmurs, no rubs, or gallops Lungs:   CTA b/l, no wheezing, rhonchi or rales  Abd: soft, nontender MS: no deformity or atrophy Ext:  no edema  Skin: warm and dry, no rash Neuro:  No gross deficits appreciated Psych: euthymic mood, full affect  ICD site is stable, no  tethering or discomfort   EKG:  Done today and reviewed by myself sinus rhythm, rate 75, QRS  01/27/16: CPX Conclusion: Exercise testing with gas exchange demonstrates a moderate functional impairment when compared to matched sedentary norms. Patient is primarily circulatory limited and there is a blunted HR and poor BP response to exercise. The markedly elevated VE/VCO2 slope and presence of exercise oscillatory ventilation suggests a worse short-term prognosis in patients with HF. Consideration should be given to referral to the HF program if symptoms warrant.  Test, report and preliminary impression by:  Lesia Hausen, MS, ACSM-RCEP 01/27/2016 2:47 PM I have edited the conclusion with my findings.   12/27/17: TTE Study Conclusions - Left ventricle: The cavity size was mildly dilated. Wall   thickness was normal. Systolic function was moderately reduced.   The estimated ejection fraction was 35%. Diffuse hypokinesis.   Doppler parameters are consistent with abnormal left ventricular   relaxation (grade 1 diastolic dysfunction).  2008: LVEF 20%  Recent Labs: 12/18/2017: BUN 13; Creatinine, Ser 0.85; Potassium 4.7; Sodium 144  No results found for requested labs within last 8760 hours.   CrCl cannot be calculated (Patient's most recent lab result is older than the maximum 21 days allowed.).   Wt Readings from Last 3 Encounters:  12/11/18 161 lb (73 kg)  12/18/17 161 lb 6.4 oz (73.2 kg)  05/04/17 156 lb (70.8 kg)     Other studies reviewed: Additional studies/records reviewed today include: summarized above  ASSESSMENT AND PLAN:  1. NICM Euvolemic on exam Normal ICD function See PaceArt report No changes today  2. HTN Stable No change required today  Disposition: F/u with [facility], Dr Jens Som as scheduled, Dr Graciela Husbands 1 year   Current medicines are reviewed at length with the patient today.  The patient did not have any concerns regarding  medicines.  Signed, Gypsy Balsam, NP 12/11/2018 10:46 AM   [facility] [address] [place] [address] [phone] (office)  [phone] (fax)

## 2018-12-11 ENCOUNTER — Encounter: Payer: Self-pay | Admitting: Nurse Practitioner

## 2018-12-11 ENCOUNTER — Ambulatory Visit (INDEPENDENT_AMBULATORY_CARE_PROVIDER_SITE_OTHER): Payer: Medicare HMO | Admitting: Nurse Practitioner

## 2018-12-11 VITALS — BP 130/76 | HR 85 | Ht 68.0 in | Wt 161.0 lb

## 2018-12-11 DIAGNOSIS — I5022 Chronic systolic (congestive) heart failure: Secondary | ICD-10-CM | POA: Diagnosis not present

## 2018-12-11 DIAGNOSIS — I428 Other cardiomyopathies: Secondary | ICD-10-CM

## 2018-12-11 DIAGNOSIS — I10 Essential (primary) hypertension: Secondary | ICD-10-CM | POA: Diagnosis not present

## 2018-12-11 LAB — CUP PACEART INCLINIC DEVICE CHECK
Implantable Lead Implant Date: 20141119
Implantable Pulse Generator Implant Date: 20141119
MDC IDC LEAD LOCATION: 753860
MDC IDC SESS DTM: 20200107103108

## 2018-12-11 NOTE — Patient Instructions (Signed)
  Medication Instructions:    If you need a refill on your cardiac medications before your next appointment, please call your pharmacy.    Lab work:  If you have labs (blood work) drawn today and your tests are completely normal, you will receive your results only by: Marland Kitchen MyChart Message (if you have MyChart) OR . A paper copy in the mail If you have any lab test that is abnormal or we need to change your treatment, we will call you to review the results.   Testing/Procedures:   Follow-Up: At [facility], you and your health needs are our priority.  As part of our continuing mission to provide you with exceptional heart care, we have created designated Provider Care Teams.  These Care Teams include your primary Cardiologist (physician) and Advanced Practice Providers (APPs -  Physician Assistants and Nurse Practitioners) who all work together to provide you with the care you need, when you need it. You will need a follow up appointment in 1 years.  Please call our office 2 months in advance to schedule this appointment.  You may see Dr. Graciela Husbands  or one of the following Advanced Practice Providers on your designated Care Team:   Gypsy Balsam, NP . Francis Dowse, PA-C    Remote monitoring is used to monitor your Pacemaker of ICD from home. This monitoring reduces the number of office visits required to check your device to one time per year. It allows [place] to keep an eye on the functioning of your device to ensure it is working properly. You are scheduled for a device check from home on . 02-06-19 You may send your transmission at any time that day. If you have a wireless device, the transmission will be sent automatically. After your physician reviews your transmission, you will receive a postcard with your next transmission date.    Any Other Special Instructions Will Be Listed Below (If Applicable).

## 2018-12-28 LAB — CUP PACEART REMOTE DEVICE CHECK
Date Time Interrogation Session: 20200124200435
Implantable Lead Implant Date: 20141119
Implantable Lead Location: 753860
Implantable Pulse Generator Implant Date: 20141119

## 2018-12-31 NOTE — Progress Notes (Signed)
HPI: FU nonischemic cardiomyopathy and prior ICD. Prior cardiac catheterization in October of 2003 revealed an ejection fraction of 10% and normal coronary arteries. CPX February 2017 showed moderate functional impairment secondary to CHF. He is also being considered for upgrade to CRT. Last echo 1/19 showed EF 35 and G1 DD. Since he was last seen, the patient has dyspnea with more extreme activities but not with routine activities. It is relieved with rest. It is not associated with chest pain. There is no orthopnea, PND or pedal edema. There is no syncope or palpitations. There is no exertional chest pain.   Current Outpatient Medications  Medication Sig Dispense Refill  . aspirin EC 81 MG tablet Take 1 tablet (81 mg total) by mouth daily. 30 tablet 11  . carvedilol (COREG) 12.5 MG tablet TAKE 1 TABLET TWICE DAILY 180 tablet 1  . ENTRESTO 49-51 MG TAKE 1 TABLET TWICE DAILY 180 tablet 1  . furosemide (LASIX) 20 MG tablet Take 1 tablet (20 mg total) by mouth as needed for edema. 90 tablet 3  . Multiple Vitamins-Minerals (MULTIVITAMIN WITH MINERALS) tablet Take 1 tablet by mouth daily.    Marland Kitchen spironolactone (ALDACTONE) 25 MG tablet TAKE 1 TABLET (25 MG TOTAL) BY MOUTH DAILY. 90 tablet 1   No current facility-administered medications for this visit.      Past Medical History:  Diagnosis Date  . [phone] lead removed from service 11/14   . ALCOHOL ABUSE 06/18/2009  . dilated cardiomyopathy   . FATTY LIVER DISEASE 02/14/2008  . Hypertension   . ICD-Medtronic 11/08/2011   generator replacment 11/14  . LBBB (left bundle branch block)   . Sinus bradycardia   . Systolic CHF, chronic (HCC)     Past Surgical History:  Procedure Laterality Date  . BI-VENTRICULAR IMPLANTABLE CARDIOVERTER DEFIBRILLATOR N/A 10/23/2013   Procedure: BI-VENTRICULAR IMPLANTABLE CARDIOVERTER DEFIBRILLATOR  (CRT-D);  Surgeon: Duke Salvia, MD;  Location: [facility] CATH LAB;  Service: Cardiovascular;  Laterality: N/A;  .  CARDIAC CATHETERIZATION  09/12/2002  . IMPLANTABLE CARDIOVERTER DEFIBRILLATOR IMPLANT  2014   Medtronic Maximo 7232; gen change and RV lead revision 10-23-2013 by Dr Graciela Husbands    Social History   Socioeconomic History  . Marital status: Married    Spouse name: Not on file  . Number of children: Not on file  . Years of education: Not on file  . Highest education level: Not on file  Occupational History  . Not on file  Social Needs  . Financial resource strain: Not on file  . Food insecurity:    Worry: Not on file    Inability: Not on file  . Transportation needs:    Medical: Not on file    Non-medical: Not on file  Tobacco Use  . Smoking status: Never Smoker  . Smokeless tobacco: Never Used  Substance and Sexual Activity  . Alcohol use: Yes    Comment: i HAVE NOT DRANK IN 2& HALF YEARS"  . Drug use: No  . Sexual activity: Not on file  Lifestyle  . Physical activity:    Days per week: Not on file    Minutes per session: Not on file  . Stress: Not on file  Relationships  . Social connections:    Talks on phone: Not on file    Gets together: Not on file    Attends religious service: Not on file    Active member of club or organization: Not on file    Attends  meetings of clubs or organizations: Not on file    Relationship status: Not on file  . Intimate partner violence:    Fear of current or ex partner: Not on file    Emotionally abused: Not on file    Physically abused: Not on file    Forced sexual activity: Not on file  Other Topics Concern  . Not on file  Social History Narrative  . Not on file    Family History  Problem Relation Age of Onset  . Heart Problems Mother   . Hypertension Mother   . Cancer - Lung Father   . Cancer - Other Father 58       liver,shoulder  . Hypertension Brother   . Other Son        good health  . Other Son        good health  . Other Daughter        good health    ROS: no fevers or chills, productive cough, hemoptysis,  dysphasia, odynophagia, melena, hematochezia, dysuria, hematuria, rash, seizure activity, orthopnea, PND, pedal edema, claudication. Remaining systems are negative.  Physical Exam: Well-developed well-nourished in no acute distress.  Skin is warm and dry.  HEENT is normal.  Neck is supple.  Chest is clear to auscultation with normal expansion.  Cardiovascular exam is regular rate and rhythm.  Abdominal exam nontender or distended. No masses palpated. Extremities show no edema. neuro grossly intact   A/P  1 nonischemic cardiomyopathy-patient appears to be doing reasonably well.  Continue beta-blocker and Entresto.  Check potassium and renal function.  2 hypertension-patient's blood pressure is controlled.  Continue present medications and follow.  3 chronic systolic congestive heart failure-he is euvolemic today on examination.  We discussed the importance of fluid restriction and low-sodium diet.  Continue present dose of diuretics.  Check potassium and renal function.  4 prior ICD-followed by electrophysiology.  Olga Millers, MD

## 2019-01-07 ENCOUNTER — Other Ambulatory Visit: Payer: Self-pay | Admitting: Cardiology

## 2019-01-07 DIAGNOSIS — I428 Other cardiomyopathies: Secondary | ICD-10-CM

## 2019-01-07 NOTE — Telephone Encounter (Signed)
Rx request sent to pharmacy.  

## 2019-01-11 ENCOUNTER — Ambulatory Visit: Payer: Medicare HMO | Admitting: Cardiology

## 2019-01-11 ENCOUNTER — Encounter: Payer: Self-pay | Admitting: Cardiology

## 2019-01-11 VITALS — BP 120/78 | HR 60 | Ht 68.0 in | Wt 168.6 lb

## 2019-01-11 DIAGNOSIS — I428 Other cardiomyopathies: Secondary | ICD-10-CM | POA: Diagnosis not present

## 2019-01-11 DIAGNOSIS — I5022 Chronic systolic (congestive) heart failure: Secondary | ICD-10-CM | POA: Diagnosis not present

## 2019-01-11 DIAGNOSIS — I10 Essential (primary) hypertension: Secondary | ICD-10-CM

## 2019-01-11 LAB — BASIC METABOLIC PANEL
BUN/Creatinine Ratio: 11 (ref 9–20)
BUN: 8 mg/dL (ref 6–24)
CALCIUM: 9.4 mg/dL (ref 8.7–10.2)
CO2: 31 mmol/L — ABNORMAL HIGH (ref 20–29)
CREATININE: 0.74 mg/dL — AB (ref 0.76–1.27)
Chloride: 103 mmol/L (ref 96–106)
GFR calc Af Amer: 120 mL/min/{1.73_m2} (ref >59)
GFR calc non Af Amer: 104 mL/min/{1.73_m2} (ref >59)
GLUCOSE: 165 mg/dL — AB (ref 65–99)
Potassium: 4.2 mmol/L (ref 3.5–5.2)
Sodium: 141 mmol/L (ref 134–144)

## 2019-01-11 MED ORDER — FUROSEMIDE 20 MG PO TABS: 20.0000 mg | ORAL_TABLET | ORAL | 3 refills | Status: DC | PRN

## 2019-01-11 MED ORDER — CARVEDILOL 12.5 MG PO TABS: 12.5000 mg | ORAL_TABLET | Freq: Two times a day (BID) | ORAL | 3 refills | Status: DC

## 2019-01-11 MED ORDER — SACUBITRIL-VALSARTAN 49-51 MG PO TABS: 1.0000 | ORAL_TABLET | Freq: Two times a day (BID) | ORAL | 3 refills | Status: DC

## 2019-01-11 MED ORDER — SPIRONOLACTONE 25 MG PO TABS: 25.0000 mg | ORAL_TABLET | Freq: Every day | ORAL | 3 refills | Status: DC

## 2019-01-11 NOTE — Patient Instructions (Signed)
Medication Instructions:  Refill sent to the pharmacy electronically.  If you need a refill on your cardiac medications before your next appointment, please call your pharmacy.   Lab work: Your physician recommends that you have lab work today If you have labs (blood work) drawn today and your tests are completely normal, you will receive your results only by: Marland Kitchen MyChart Message (if you have MyChart) OR . A paper copy in the mail If you have any lab test that is abnormal or we need to change your treatment, we will call you to review the results.  Follow-Up: At [facility], you and your health needs are our priority.  As part of our continuing mission to provide you with exceptional heart care, we have created designated Provider Care Teams.  These Care Teams include your primary Cardiologist (physician) and Advanced Practice Providers (APPs -  Physician Assistants and Nurse Practitioners) who all work together to provide you with the care you need, when you need it. You will need a follow up appointment in 12 months.  Please call our office 2 months in advance to schedule this appointment.  You may see Olga Millers, MD or one of the following Advanced Practice Providers on your designated Care Team:   Corine Shelter, PA-C Judy Pimple, [place] . Marjie Skiff, PA-C  Call in December to schedule follow up in February

## 2019-01-14 ENCOUNTER — Encounter: Payer: Self-pay | Admitting: *Deleted

## 2019-02-06 ENCOUNTER — Ambulatory Visit (INDEPENDENT_AMBULATORY_CARE_PROVIDER_SITE_OTHER): Payer: Medicare HMO | Admitting: *Deleted

## 2019-02-06 DIAGNOSIS — I428 Other cardiomyopathies: Secondary | ICD-10-CM | POA: Diagnosis not present

## 2019-02-06 DIAGNOSIS — I5022 Chronic systolic (congestive) heart failure: Secondary | ICD-10-CM

## 2019-02-10 LAB — CUP PACEART REMOTE DEVICE CHECK
Battery Remaining Longevity: 87 mo
Battery Voltage: 3 V
Brady Statistic RV Percent Paced: 0.01 %
Date Time Interrogation Session: 20200304093823
HighPow Impedance: 71 Ohm
Implantable Lead Implant Date: 20141119
Implantable Lead Location: 753860
Implantable Pulse Generator Implant Date: 20141119
Lead Channel Impedance Value: 551 Ohm
Lead Channel Pacing Threshold Amplitude: 0.625 V
Lead Channel Pacing Threshold Pulse Width: 0.4 ms
Lead Channel Sensing Intrinsic Amplitude: 5.875 mV
Lead Channel Sensing Intrinsic Amplitude: 5.875 mV
Lead Channel Setting Pacing Amplitude: 2.5 V
Lead Channel Setting Pacing Pulse Width: 0.4 ms
Lead Channel Setting Sensing Sensitivity: 0.3 mV
MDC IDC MSMT LEADCHNL RV IMPEDANCE VALUE: 456 Ohm

## 2019-02-14 NOTE — Progress Notes (Signed)
Remote ICD transmission.   

## 2019-05-08 ENCOUNTER — Ambulatory Visit: Payer: Medicare HMO | Admitting: *Deleted

## 2019-05-09 LAB — CUP PACEART REMOTE DEVICE CHECK
Battery Remaining Longevity: 81 mo
Battery Voltage: 3 V
Brady Statistic RV Percent Paced: 0.01 %
Date Time Interrogation Session: 20200603132409
HighPow Impedance: 80 Ohm
Implantable Lead Implant Date: 20141119
Implantable Lead Location: 753860
Implantable Pulse Generator Implant Date: 20141119
Lead Channel Impedance Value: 646 Ohm
Lead Channel Impedance Value: 665 Ohm
Lead Channel Pacing Threshold Amplitude: 0.625 V
Lead Channel Pacing Threshold Pulse Width: 0.4 ms
Lead Channel Sensing Intrinsic Amplitude: 6.125 mV
Lead Channel Sensing Intrinsic Amplitude: 6.125 mV
Lead Channel Setting Pacing Amplitude: 2.5 V
Lead Channel Setting Pacing Pulse Width: 0.4 ms
Lead Channel Setting Sensing Sensitivity: 0.3 mV

## 2019-05-17 ENCOUNTER — Encounter: Payer: Self-pay | Admitting: Cardiology

## 2019-12-09 ENCOUNTER — Ambulatory Visit (INDEPENDENT_AMBULATORY_CARE_PROVIDER_SITE_OTHER): Payer: Medicare HMO | Admitting: *Deleted

## 2019-12-09 DIAGNOSIS — I428 Other cardiomyopathies: Secondary | ICD-10-CM

## 2019-12-09 LAB — CUP PACEART REMOTE DEVICE CHECK
Battery Remaining Longevity: 75 mo
Battery Voltage: 2.99 V
Brady Statistic RV Percent Paced: 0.01 %
Date Time Interrogation Session: 20210104114707
HighPow Impedance: 74 Ohm
Implantable Lead Implant Date: 20141119
Implantable Lead Location: 753860
Implantable Pulse Generator Implant Date: 20141119
Lead Channel Impedance Value: 475 Ohm
Lead Channel Impedance Value: 551 Ohm
Lead Channel Pacing Threshold Amplitude: 0.625 V
Lead Channel Pacing Threshold Pulse Width: 0.4 ms
Lead Channel Sensing Intrinsic Amplitude: 5.375 mV
Lead Channel Sensing Intrinsic Amplitude: 5.375 mV
Lead Channel Setting Pacing Amplitude: 2.5 V
Lead Channel Setting Pacing Pulse Width: 0.4 ms
Lead Channel Setting Sensing Sensitivity: 0.3 mV

## 2020-01-01 ENCOUNTER — Other Ambulatory Visit: Payer: Self-pay

## 2020-01-01 ENCOUNTER — Encounter: Payer: Self-pay | Admitting: Internal Medicine

## 2020-01-01 ENCOUNTER — Ambulatory Visit: Payer: Medicare HMO | Admitting: Internal Medicine

## 2020-01-01 VITALS — BP 130/84 | HR 65 | Ht 68.0 in | Wt 161.2 lb

## 2020-01-01 DIAGNOSIS — I447 Left bundle-branch block, unspecified: Secondary | ICD-10-CM | POA: Diagnosis not present

## 2020-01-01 DIAGNOSIS — Z95810 Presence of automatic (implantable) cardiac defibrillator: Secondary | ICD-10-CM | POA: Diagnosis not present

## 2020-01-01 DIAGNOSIS — I428 Other cardiomyopathies: Secondary | ICD-10-CM | POA: Diagnosis not present

## 2020-01-01 DIAGNOSIS — I509 Heart failure, unspecified: Secondary | ICD-10-CM

## 2020-01-01 DIAGNOSIS — Z79899 Other long term (current) drug therapy: Secondary | ICD-10-CM

## 2020-01-01 NOTE — Progress Notes (Signed)
Patient Care Team: Olive Bass, MD as PCP - General (Unknown Physician Specialty) Lewayne Bunting, MD as PCP - Cardiology (Cardiology)   HPI  Robert Massey is a 59 y.o. male Seen in followup for nonischemic cardiomyopathy for which he is status post ICD implantation. 11/14 he underwent generator system replacement with a new lead to replace a 6949-lead and a new generator at [facility]   There has been interval improvement in his EF so that notwithstanding his LBBB he did not undergo CRT upgrade  (45%<<10%)  He saw Dr. Marsa Aris 8/16. Repeat echocardiogram demonstrated ejection fraction of 30-35%. Anticipated follow-up 6 months after his appointment 2/17 with Dr. Marsa Aris never happened.  DATE TEST EF   8/16 Echo   30-35 %   1/19 Echo   35 %          Cardiopulmonary stress test 2/17 had demonstrated a moderately limited exercise capacity.  Date Cr K Mg  2/17  0.82 4.6     2/20 0.74 4.2      The patient denies chest pain, shortness of breath, nocturnal dyspnea, orthopnea or peripheral edema.  There have been no lightheadedness or syncope.    Most of his complaints relate to fatigue.  Has some palpitations  Told the story of the death of his cat Claris Gower Past Medical History:  Diagnosis Date  . [phone] lead removed from service 11/14   . ALCOHOL ABUSE 06/18/2009  . dilated cardiomyopathy   . FATTY LIVER DISEASE 02/14/2008  . Hypertension   . ICD-Medtronic 11/08/2011   generator replacment 11/14  . LBBB (left bundle branch block)   . Sinus bradycardia   . Systolic CHF, chronic (HCC)     Past Surgical History:  Procedure Laterality Date  . BI-VENTRICULAR IMPLANTABLE CARDIOVERTER DEFIBRILLATOR N/A 10/23/2013   Procedure: BI-VENTRICULAR IMPLANTABLE CARDIOVERTER DEFIBRILLATOR  (CRT-D);  Surgeon: Duke Salvia, MD;  Location: [facility] CATH LAB;  Service: Cardiovascular;  Laterality: N/A;  . CARDIAC CATHETERIZATION  09/12/2002  . IMPLANTABLE CARDIOVERTER DEFIBRILLATOR IMPLANT  2014   Medtronic Maximo 7232; gen change and RV lead revision 10-23-2013 by Dr Graciela Husbands    Current Outpatient Medications  Medication Sig Dispense Refill  . aspirin EC 81 MG tablet Take 1 tablet (81 mg total) by mouth daily. 30 tablet 11  . carvedilol (COREG) 12.5 MG tablet Take 1 tablet (12.5 mg total) by mouth 2 (two) times daily. 180 tablet 3  . furosemide (LASIX) 20 MG tablet Take 1 tablet (20 mg total) by mouth as needed for edema. 90 tablet 3  . Multiple Vitamins-Minerals (MULTIVITAMIN WITH MINERALS) tablet Take 1 tablet by mouth daily.    . sacubitril-valsartan (ENTRESTO) 49-51 MG Take 1 tablet by mouth 2 (two) times daily. 180 tablet 3  . spironolactone (ALDACTONE) 25 MG tablet Take 1 tablet (25 mg total) by mouth daily. 90 tablet 3   No current facility-administered medications for this visit.    Allergies  Allergen Reactions  . Penicillins Itching    Review of Systems negative except from HPI and PMH  Physical Exam BP 130/84   Pulse 65   Ht 5\' 8"  (1.727 m)   Wt 161 lb 3.2 oz (73.1 kg)   SpO2 99%   BMI 24.51 kg/m  Well developed and well nourished in no acute distress HENT normal Neck supple with JVP-flat Clear Device pocket well healed; without hematoma or erythema.  There is no tethering  Regular rate and rhythm, no  murmur Abd-soft  with active BS No Clubbing cyanosis   edema Skin-warm and dry A & Oriented  Grossly normal sensory and motor function  ECG 12-lead sinus rhythm at 65 117/15/43 Left bundle branch block with right axis deviation   Assessment and  Plan  Nonischemic cardiomyopathy    Sinus bradycardia    ICD-primary prevention     LBBB  CHF chronic systolic    High Risk Medication Surveillance  spironolactone  Depression   VT NS   Supraventricular tachycardia  Supraventricular tachycardias recurring.  I am not sure the mechanism of it.  We will reprogram his monitoring zone so as to look for the onset.  The device appropriately without  therapy.  Euvolemic continue current meds  We will check potassium on his Aldactone today.

## 2020-01-01 NOTE — Patient Instructions (Signed)
Medication Instructions:  Your physician recommends that you continue on your current medications as directed. Please refer to the Current Medication list given to you today.  Labwork: BMET today.  Testing/Procedures: None ordered.  Follow-Up: Your physician wants you to follow-up in: 6 months with Joanell Rising. You will receive a reminder letter in the mail two months in advance. If you don't receive a letter, please call our office to schedule the follow-up appointment.  Remote monitoring is used to monitor your Pacemaker of ICD from home. This monitoring reduces the number of office visits required to check your device to one time per year. It allows [place] to keep an eye on the functioning of your device to ensure it is working properly.   Any Other Special Instructions Will Be Listed Below (If Applicable).  If you need a refill on your cardiac medications before your next appointment, please call your pharmacy.

## 2020-01-02 LAB — BASIC METABOLIC PANEL
BUN/Creatinine Ratio: 10 (ref 9–20)
BUN: 8 mg/dL (ref 6–24)
CO2: 28 mmol/L (ref 20–29)
Calcium: 9.7 mg/dL (ref 8.7–10.2)
Chloride: 102 mmol/L (ref 96–106)
Creatinine, Ser: 0.83 mg/dL (ref 0.76–1.27)
GFR calc Af Amer: 114 mL/min/{1.73_m2} (ref >59)
GFR calc non Af Amer: 98 mL/min/{1.73_m2} (ref >59)
Glucose: 106 mg/dL — ABNORMAL HIGH (ref 65–99)
Potassium: 4.7 mmol/L (ref 3.5–5.2)
Sodium: 143 mmol/L (ref 134–144)

## 2020-01-03 NOTE — Progress Notes (Signed)
HPI: FU nonischemic cardiomyopathy and prior ICD. Prior cardiac catheterization in October of 2003 revealed an ejection fraction of 10% and normal coronary arteries. CPX February 2017 showed moderate functional impairment secondary to CHF. He is also being considered for upgrade to CRT. Last echo 1/19 showed EF 35 and G1 DD. Since he was last seen,he has some dyspnea on exertion unchanged.  No orthopnea, PND or pedal edema.  No exertional chest pain or syncope.  Current Outpatient Medications  Medication Sig Dispense Refill  . aspirin EC 81 MG tablet Take 1 tablet (81 mg total) by mouth daily. 30 tablet 11  . carvedilol (COREG) 12.5 MG tablet Take 1 tablet (12.5 mg total) by mouth 2 (two) times daily. 180 tablet 3  . furosemide (LASIX) 20 MG tablet Take 1 tablet (20 mg total) by mouth as needed for edema. 90 tablet 3  . Multiple Vitamins-Minerals (MULTIVITAMIN WITH MINERALS) tablet Take 1 tablet by mouth daily.    . sacubitril-valsartan (ENTRESTO) 49-51 MG Take 1 tablet by mouth 2 (two) times daily. 180 tablet 3  . spironolactone (ALDACTONE) 25 MG tablet Take 1 tablet (25 mg total) by mouth daily. 90 tablet 3   No current facility-administered medications for this visit.     Past Medical History:  Diagnosis Date  . [phone] lead removed from service 11/14   . ALCOHOL ABUSE 06/18/2009  . dilated cardiomyopathy   . FATTY LIVER DISEASE 02/14/2008  . Hypertension   . ICD-Medtronic 11/08/2011   generator replacment 11/14  . LBBB (left bundle branch block)   . Sinus bradycardia   . Systolic CHF, chronic ([place])     Past Surgical History:  Procedure Laterality Date  . BI-VENTRICULAR IMPLANTABLE CARDIOVERTER DEFIBRILLATOR N/A 10/23/2013   Procedure: BI-VENTRICULAR IMPLANTABLE CARDIOVERTER DEFIBRILLATOR  (CRT-D);  Surgeon: Deboraha Sprang, MD;  Location: [facility] CATH LAB;  Service: Cardiovascular;  Laterality: N/A;  . CARDIAC CATHETERIZATION  09/12/2002  . IMPLANTABLE CARDIOVERTER DEFIBRILLATOR  IMPLANT  2014   Medtronic Maximo 8413; gen change and RV lead revision 10-23-2013 by Dr Caryl Comes    Social History   Socioeconomic History  . Marital status: Married    Spouse name: Not on file  . Number of children: Not on file  . Years of education: Not on file  . Highest education level: Not on file  Occupational History  . Not on file  Tobacco Use  . Smoking status: Never Smoker  . Smokeless tobacco: Never Used  Substance and Sexual Activity  . Alcohol use: Yes    Comment: i HAVE NOT DRANK IN 2& HALF YEARS"  . Drug use: No  . Sexual activity: Not on file  Other Topics Concern  . Not on file  Social History Narrative  . Not on file   Social Determinants of Health   Financial Resource Strain:   . Difficulty of Paying Living Expenses: Not on file  Food Insecurity:   . Worried About Charity fundraiser in the Last Year: Not on file  . Ran Out of Food in the Last Year: Not on file  Transportation Needs:   . Lack of Transportation (Medical): Not on file  . Lack of Transportation (Non-Medical): Not on file  Physical Activity:   . Days of Exercise per Week: Not on file  . Minutes of Exercise per Session: Not on file  Stress:   . Feeling of Stress : Not on file  Social Connections:   . Frequency of Communication with  Friends and Family: Not on file  . Frequency of Social Gatherings with Friends and Family: Not on file  . Attends Religious Services: Not on file  . Active Member of Clubs or Organizations: Not on file  . Attends Banker Meetings: Not on file  . Marital Status: Not on file  Intimate Partner Violence:   . Fear of Current or Ex-Partner: Not on file  . Emotionally Abused: Not on file  . Physically Abused: Not on file  . Sexually Abused: Not on file    Family History  Problem Relation Age of Onset  . Heart Problems Mother   . Hypertension Mother   . Cancer - Lung Father   . Cancer - Other Father 60       liver,shoulder  . Hypertension  Brother   . Other Son        good health  . Other Son        good health  . Other Daughter        good health    ROS: no fevers or chills, productive cough, hemoptysis, dysphasia, odynophagia, melena, hematochezia, dysuria, hematuria, rash, seizure activity, orthopnea, PND, pedal edema, claudication. Remaining systems are negative.  Physical Exam: Well-developed well-nourished in no acute distress.  Skin is warm and dry.  HEENT is normal.  Neck is supple.  Chest is clear to auscultation with normal expansion.  Cardiovascular exam is regular rate and rhythm.  Abdominal exam nontender or distended. No masses palpated. Extremities show no edema. neuro grossly intact  A/P  1 history of nonischemic cardiomyopathy-patient doing well from a symptomatic standpoint.  Continue beta-blocker and Entresto.  Check potassium and renal function.  Repeat echocardiogram.  2 hypertension-blood pressure is controlled today.  Continue present medical regimen.  3 chronic systolic congestive heart failure-continue present dose of diuretic as patient is euvolemic.   4 prior ICD-followed by electrophysiology.  Olga Millers, MD

## 2020-01-13 ENCOUNTER — Encounter: Payer: Self-pay | Admitting: Cardiology

## 2020-01-13 ENCOUNTER — Other Ambulatory Visit: Payer: Self-pay

## 2020-01-13 ENCOUNTER — Ambulatory Visit: Payer: Medicare HMO | Admitting: Cardiology

## 2020-01-13 VITALS — BP 126/80 | HR 75 | Temp 97.3°F | Ht 68.0 in | Wt 164.0 lb

## 2020-01-13 DIAGNOSIS — I10 Essential (primary) hypertension: Secondary | ICD-10-CM | POA: Diagnosis not present

## 2020-01-13 DIAGNOSIS — I509 Heart failure, unspecified: Secondary | ICD-10-CM

## 2020-01-13 DIAGNOSIS — I428 Other cardiomyopathies: Secondary | ICD-10-CM

## 2020-01-13 DIAGNOSIS — Z95810 Presence of automatic (implantable) cardiac defibrillator: Secondary | ICD-10-CM | POA: Diagnosis not present

## 2020-01-13 NOTE — Patient Instructions (Signed)
Medication Instructions:  NO CHANGE *If you need a refill on your cardiac medications before your next appointment, please call your pharmacy*  Lab Work: If you have labs (blood work) drawn today and your tests are completely normal, you will receive your results only by: . MyChart Message (if you have MyChart) OR . A paper copy in the mail If you have any lab test that is abnormal or we need to change your treatment, we will call you to review the results.  Testing/Procedures: Your physician has requested that you have an echocardiogram. Echocardiography is a painless test that uses sound waves to create images of your heart. It provides your doctor with information about the size and shape of your heart and how well your heart's chambers and valves are working. This procedure takes approximately one hour. There are no restrictions for this procedure.[address]    Follow-Up: At [facility], you and your health needs are our priority.  As part of our continuing mission to provide you with exceptional heart care, we have created designated Provider Care Teams.  These Care Teams include your primary Cardiologist (physician) and Advanced Practice Providers (APPs -  Physician Assistants and Nurse Practitioners) who all work together to provide you with the care you need, when you need it.  Your next appointment:   12 month(s)  The format for your next appointment:   Either In Person or Virtual  Provider:   You may see Brian Crenshaw, MD or one of the following Advanced Practice Providers on your designated Care Team:    Luke Kilroy, PA-C  Callie Goodrich, PA-C  Jesse Cleaver, FNP    

## 2020-01-28 ENCOUNTER — Other Ambulatory Visit: Payer: Self-pay

## 2020-01-28 ENCOUNTER — Ambulatory Visit (HOSPITAL_COMMUNITY): Payer: Medicare HMO | Attending: Cardiology

## 2020-01-28 DIAGNOSIS — I428 Other cardiomyopathies: Secondary | ICD-10-CM | POA: Diagnosis present

## 2020-03-09 ENCOUNTER — Ambulatory Visit (INDEPENDENT_AMBULATORY_CARE_PROVIDER_SITE_OTHER): Payer: Medicare HMO | Admitting: *Deleted

## 2020-03-09 DIAGNOSIS — I428 Other cardiomyopathies: Secondary | ICD-10-CM

## 2020-03-11 LAB — CUP PACEART REMOTE DEVICE CHECK
Battery Remaining Longevity: 69 mo
Battery Voltage: 2.99 V
Brady Statistic RV Percent Paced: 0.01 %
Date Time Interrogation Session: 20210406132021
HighPow Impedance: 79 Ohm
Implantable Lead Implant Date: 20141119
Implantable Lead Location: 753860
Implantable Pulse Generator Implant Date: 20141119
Lead Channel Impedance Value: 532 Ohm
Lead Channel Impedance Value: 589 Ohm
Lead Channel Pacing Threshold Amplitude: 0.625 V
Lead Channel Pacing Threshold Pulse Width: 0.4 ms
Lead Channel Sensing Intrinsic Amplitude: 7.375 mV
Lead Channel Sensing Intrinsic Amplitude: 7.375 mV
Lead Channel Setting Pacing Amplitude: 2.5 V
Lead Channel Setting Pacing Pulse Width: 0.4 ms
Lead Channel Setting Sensing Sensitivity: 0.3 mV

## 2020-05-18 ENCOUNTER — Other Ambulatory Visit: Payer: Self-pay | Admitting: Cardiology

## 2020-05-18 DIAGNOSIS — I428 Other cardiomyopathies: Secondary | ICD-10-CM

## 2020-06-09 ENCOUNTER — Ambulatory Visit (INDEPENDENT_AMBULATORY_CARE_PROVIDER_SITE_OTHER): Payer: Medicare HMO | Admitting: *Deleted

## 2020-06-09 DIAGNOSIS — I428 Other cardiomyopathies: Secondary | ICD-10-CM | POA: Diagnosis not present

## 2020-06-09 LAB — CUP PACEART REMOTE DEVICE CHECK
Battery Remaining Longevity: 64 mo
Battery Voltage: 2.97 V
Brady Statistic RV Percent Paced: 0.01 %
Date Time Interrogation Session: 20210706121557
HighPow Impedance: 79 Ohm
Implantable Lead Implant Date: 20141119
Implantable Lead Location: 753860
Implantable Pulse Generator Implant Date: 20141119
Lead Channel Impedance Value: 475 Ohm
Lead Channel Impedance Value: 608 Ohm
Lead Channel Pacing Threshold Amplitude: 0.625 V
Lead Channel Pacing Threshold Pulse Width: 0.4 ms
Lead Channel Sensing Intrinsic Amplitude: 4.625 mV
Lead Channel Sensing Intrinsic Amplitude: 4.625 mV
Lead Channel Setting Pacing Amplitude: 2.5 V
Lead Channel Setting Pacing Pulse Width: 0.4 ms
Lead Channel Setting Sensing Sensitivity: 0.3 mV

## 2020-06-10 NOTE — Progress Notes (Signed)
Remote ICD transmission.   

## 2020-09-07 ENCOUNTER — Ambulatory Visit (INDEPENDENT_AMBULATORY_CARE_PROVIDER_SITE_OTHER): Payer: Medicare HMO

## 2020-09-07 DIAGNOSIS — I428 Other cardiomyopathies: Secondary | ICD-10-CM | POA: Diagnosis not present

## 2020-09-08 LAB — CUP PACEART REMOTE DEVICE CHECK
Battery Remaining Longevity: 57 mo
Battery Voltage: 2.99 V
Brady Statistic RV Percent Paced: 0.01 %
Date Time Interrogation Session: 20211004115541
HighPow Impedance: 73 Ohm
Implantable Lead Implant Date: 20141119
Implantable Lead Location: 753860
Implantable Pulse Generator Implant Date: 20141119
Lead Channel Impedance Value: 475 Ohm
Lead Channel Impedance Value: 589 Ohm
Lead Channel Pacing Threshold Amplitude: 0.625 V
Lead Channel Pacing Threshold Pulse Width: 0.4 ms
Lead Channel Sensing Intrinsic Amplitude: 6.75 mV
Lead Channel Sensing Intrinsic Amplitude: 6.75 mV
Lead Channel Setting Pacing Amplitude: 2.5 V
Lead Channel Setting Pacing Pulse Width: 0.4 ms
Lead Channel Setting Sensing Sensitivity: 0.3 mV

## 2020-09-10 NOTE — Progress Notes (Signed)
Remote ICD transmission.   

## 2020-12-09 ENCOUNTER — Ambulatory Visit (INDEPENDENT_AMBULATORY_CARE_PROVIDER_SITE_OTHER): Payer: Medicare HMO

## 2020-12-09 DIAGNOSIS — I428 Other cardiomyopathies: Secondary | ICD-10-CM | POA: Diagnosis not present

## 2020-12-09 LAB — CUP PACEART REMOTE DEVICE CHECK
Battery Remaining Longevity: 53 mo
Battery Voltage: 2.98 V
Brady Statistic RV Percent Paced: 0.01 %
Date Time Interrogation Session: 20220105142759
HighPow Impedance: 79 Ohm
Implantable Lead Implant Date: 20141119
Implantable Lead Location: 753860
Implantable Pulse Generator Implant Date: 20141119
Lead Channel Impedance Value: 589 Ohm
Lead Channel Impedance Value: 722 Ohm
Lead Channel Pacing Threshold Amplitude: 0.625 V
Lead Channel Pacing Threshold Pulse Width: 0.4 ms
Lead Channel Sensing Intrinsic Amplitude: 8.25 mV
Lead Channel Sensing Intrinsic Amplitude: 8.25 mV
Lead Channel Setting Pacing Amplitude: 2.5 V
Lead Channel Setting Pacing Pulse Width: 0.4 ms
Lead Channel Setting Sensing Sensitivity: 0.3 mV

## 2020-12-23 NOTE — Progress Notes (Signed)
Remote ICD transmission.   

## 2021-02-15 ENCOUNTER — Ambulatory Visit: Payer: Medicare HMO | Admitting: Cardiology

## 2021-02-15 ENCOUNTER — Encounter: Payer: Self-pay | Admitting: Cardiology

## 2021-02-15 ENCOUNTER — Other Ambulatory Visit: Payer: Self-pay

## 2021-02-15 VITALS — BP 132/76 | HR 68 | Ht 68.0 in | Wt 153.0 lb

## 2021-02-15 DIAGNOSIS — I10 Essential (primary) hypertension: Secondary | ICD-10-CM | POA: Diagnosis not present

## 2021-02-15 DIAGNOSIS — I428 Other cardiomyopathies: Secondary | ICD-10-CM | POA: Diagnosis not present

## 2021-02-15 DIAGNOSIS — I509 Heart failure, unspecified: Secondary | ICD-10-CM

## 2021-02-15 DIAGNOSIS — Z95810 Presence of automatic (implantable) cardiac defibrillator: Secondary | ICD-10-CM | POA: Diagnosis not present

## 2021-02-15 LAB — BASIC METABOLIC PANEL
BUN/Creatinine Ratio: 13 (ref 9–20)
BUN: 11 mg/dL (ref 6–24)
CO2: 26 mmol/L (ref 20–29)
Calcium: 9.5 mg/dL (ref 8.7–10.2)
Chloride: 103 mmol/L (ref 96–106)
Creatinine, Ser: 0.83 mg/dL (ref 0.76–1.27)
Glucose: 115 mg/dL — ABNORMAL HIGH (ref 65–99)
Potassium: 5.3 mmol/L — ABNORMAL HIGH (ref 3.5–5.2)
Sodium: 141 mmol/L (ref 134–144)
eGFR: 102 mL/min/{1.73_m2} (ref >59)

## 2021-02-15 MED ORDER — CARVEDILOL 25 MG PO TABS: 25.0000 mg | ORAL_TABLET | Freq: Two times a day (BID) | ORAL | 3 refills | Status: DC

## 2021-02-15 NOTE — Patient Instructions (Signed)
Medication Instructions:   INCREASE CARVEDILOL TO 25 MG TWICE DAILY= 2 OF THE 12.5 MG TABLETS TWICE DAILY  *If you need a refill on your cardiac medications before your next appointment, please call your pharmacy*  Follow-Up: At [facility], you and your health needs are our priority.  As part of our continuing mission to provide you with exceptional heart care, we have created designated Provider Care Teams.  These Care Teams include your primary Cardiologist (physician) and Advanced Practice Providers (APPs -  Physician Assistants and Nurse Practitioners) who all work together to provide you with the care you need, when you need it.  We recommend signing up for the patient portal called "MyChart".  Sign up information is provided on this After Visit Summary.  MyChart is used to connect with patients for Virtual Visits (Telemedicine).  Patients are able to view lab/test results, encounter notes, upcoming appointments, etc.  Non-urgent messages can be sent to your provider as well.   To learn more about what you can do with MyChart, go to [email or web address].    Your next appointment:   8 month(s)  The format for your next appointment:   In Person  Provider:   Olga Millers, MD

## 2021-02-15 NOTE — Progress Notes (Signed)
HPI: FU nonischemic cardiomyopathy and prior ICD. Prior cardiac catheterization in October of 2003 revealed an ejection fraction of 10% and normal coronary arteries. CPX February 2017 showed moderate functional impairment secondary to CHF. He is also being considered for upgrade to CRT.Last echo February 2021 showed ejection fraction 30 to 35%, mild left ventricular enlargement, mild left ventricular hypertrophy, mild mitral regurgitation, trace aortic insufficiency.  Since he was last seen,the patient has dyspnea with more extreme activities but not with routine activities. It is relieved with rest. It is not associated with chest pain. There is no orthopnea, PND or pedal edema. There is no syncope or palpitations. There is no exertional chest pain.   Current Outpatient Medications  Medication Sig Dispense Refill  . aspirin EC 81 MG tablet Take 1 tablet (81 mg total) by mouth daily. 30 tablet 11  . carvedilol (COREG) 12.5 MG tablet TAKE 1 TABLET TWICE DAILY 180 tablet 3  . ENTRESTO 49-51 MG TAKE 1 TABLET TWICE DAILY 180 tablet 3  . furosemide (LASIX) 20 MG tablet Take 1 tablet (20 mg total) by mouth as needed for edema. 90 tablet 3  . Multiple Vitamins-Minerals (MULTIVITAMIN WITH MINERALS) tablet Take 1 tablet by mouth daily.    Marland Kitchen spironolactone (ALDACTONE) 25 MG tablet TAKE 1 TABLET (25 MG TOTAL) BY MOUTH DAILY. 90 tablet 3   No current facility-administered medications for this visit.     Past Medical History:  Diagnosis Date  . [phone] lead removed from service 11/14   . ALCOHOL ABUSE 06/18/2009  . dilated cardiomyopathy   . FATTY LIVER DISEASE 02/14/2008  . Hypertension   . ICD-Medtronic 11/08/2011   generator replacment 11/14  . LBBB (left bundle branch block)   . Sinus bradycardia   . Systolic CHF, chronic ([facility])     Past Surgical History:  Procedure Laterality Date  . BI-VENTRICULAR IMPLANTABLE CARDIOVERTER DEFIBRILLATOR N/A 10/23/2013   Procedure: BI-VENTRICULAR  IMPLANTABLE CARDIOVERTER DEFIBRILLATOR  (CRT-D);  Surgeon: Duke Salvia, MD;  Location: [facility] CATH LAB;  Service: Cardiovascular;  Laterality: N/A;  . CARDIAC CATHETERIZATION  09/12/2002  . IMPLANTABLE CARDIOVERTER DEFIBRILLATOR IMPLANT  2014   Medtronic Maximo 7232; gen change and RV lead revision 10-23-2013 by Dr Graciela Husbands    Social History   Socioeconomic History  . Marital status: Married    Spouse name: Not on file  . Number of children: Not on file  . Years of education: Not on file  . Highest education level: Not on file  Occupational History  . Not on file  Tobacco Use  . Smoking status: Never Smoker  . Smokeless tobacco: Never Used  Vaping Use  . Vaping Use: Never used  Substance and Sexual Activity  . Alcohol use: Yes    Comment: i HAVE NOT DRANK IN 2& HALF YEARS"  . Drug use: No  . Sexual activity: Not on file  Other Topics Concern  . Not on file  Social History Narrative  . Not on file   Social Determinants of Health   Financial Resource Strain: Not on file  Food Insecurity: Not on file  Transportation Needs: Not on file  Physical Activity: Not on file  Stress: Not on file  Social Connections: Not on file  Intimate Partner Violence: Not on file    Family History  Problem Relation Age of Onset  . Heart Problems Mother   . Hypertension Mother   . Cancer - Lung Father   . Cancer - Other Father  81       liver,shoulder  . Hypertension Brother   . Other Son        good health  . Other Son        good health  . Other Daughter        good health    ROS: no fevers or chills, productive cough, hemoptysis, dysphasia, odynophagia, melena, hematochezia, dysuria, hematuria, rash, seizure activity, orthopnea, PND, pedal edema, claudication. Remaining systems are negative.  Physical Exam: Well-developed well-nourished in no acute distress.  Skin is warm and dry.  HEENT is normal.  Neck is supple.  Chest is clear to auscultation with normal expansion.   Cardiovascular exam is regular rate and rhythm.  Abdominal exam nontender or distended. No masses palpated. Extremities show no edema. neuro grossly intact  ECG-normal sinus rhythm at a rate of 68, left bundle branch block.  Personally reviewed  A/P  1 nonischemic cardiomyopathy-continue Entresto; increase carvedilol to 25 mg twice daily.  Check potassium and renal function.  2 hypertension-blood pressure controlled.  Increase carvedilol for cardiomyopathy as outlined above.  3 chronic systolic congestive heart failure-patient doing well from a volume standpoint.  Continue diuretic at present dose.  4 ICD-Per EP.  Olga Millers, MD

## 2021-03-10 ENCOUNTER — Ambulatory Visit: Payer: Medicare HMO

## 2021-03-10 DIAGNOSIS — I428 Other cardiomyopathies: Secondary | ICD-10-CM

## 2021-03-11 LAB — CUP PACEART REMOTE DEVICE CHECK
Battery Remaining Longevity: 56 mo
Battery Voltage: 2.97 V
Brady Statistic RV Percent Paced: 0.01 %
Date Time Interrogation Session: 20220406110407
HighPow Impedance: 75 Ohm
Implantable Lead Implant Date: 20141119
Implantable Lead Location: 753860
Implantable Pulse Generator Implant Date: 20141119
Lead Channel Impedance Value: 513 Ohm
Lead Channel Impedance Value: 589 Ohm
Lead Channel Pacing Threshold Amplitude: 0.5 V
Lead Channel Pacing Threshold Pulse Width: 0.4 ms
Lead Channel Sensing Intrinsic Amplitude: 6.75 mV
Lead Channel Sensing Intrinsic Amplitude: 6.75 mV
Lead Channel Setting Pacing Amplitude: 2.5 V
Lead Channel Setting Pacing Pulse Width: 0.4 ms
Lead Channel Setting Sensing Sensitivity: 0.3 mV

## 2021-03-22 NOTE — Progress Notes (Signed)
Remote ICD transmission.   

## 2021-04-30 ENCOUNTER — Encounter: Payer: Self-pay | Admitting: Internal Medicine

## 2021-04-30 ENCOUNTER — Other Ambulatory Visit: Payer: Self-pay

## 2021-04-30 ENCOUNTER — Ambulatory Visit: Payer: Medicare HMO | Admitting: Internal Medicine

## 2021-04-30 VITALS — BP 126/80 | HR 65 | Ht 68.0 in | Wt 151.2 lb

## 2021-04-30 DIAGNOSIS — Z95810 Presence of automatic (implantable) cardiac defibrillator: Secondary | ICD-10-CM | POA: Diagnosis not present

## 2021-04-30 DIAGNOSIS — I428 Other cardiomyopathies: Secondary | ICD-10-CM | POA: Diagnosis not present

## 2021-04-30 DIAGNOSIS — I447 Left bundle-branch block, unspecified: Secondary | ICD-10-CM | POA: Diagnosis not present

## 2021-04-30 DIAGNOSIS — R001 Bradycardia, unspecified: Secondary | ICD-10-CM

## 2021-04-30 DIAGNOSIS — I5022 Chronic systolic (congestive) heart failure: Secondary | ICD-10-CM | POA: Diagnosis not present

## 2021-04-30 NOTE — Progress Notes (Signed)
Patient Care Team: Olive Bass, MD as PCP - General (Unknown Physician Specialty) Lewayne Bunting, MD as PCP - Cardiology (Cardiology)   HPI  Robert Massey is a 60 y.o. male Seen in followup for nonischemic cardiomyopathy for which he is status post ICD implantation. 11/14 he underwent generator system replacement with a new lead to replace a 6949-lead and a new generator at [facility]   There has been interval improvement in his EF so that notwithstanding his LBBB he did not undergo CRT upgrade  (45%<<10%)    DATE TEST EF   8/16 Echo   30-35 %   1/19 Echo   35 %   2/21 Echo 30-35%     Cardiopulmonary stress test 2/17 had demonstrated a moderately limited exercise capacity.    Date Cr K Mg  2/17  0.82 4.6     2/20 0.74 4.2   3/22 0.83 5.3 reviewed [facility]     The patient denies chest pain , nocturnal dyspnea, orthopnea or peripheral edema.  There have been no palpitations, lightheadedness or syncope.  Complains of Some shortness of breath while traveling to  [place] visiting son in [place].  Stepping a couple of flights causes him to have shortness of breath.       Past Medical History:  Diagnosis Date  . [phone] lead removed from service 11/14   . ALCOHOL ABUSE 06/18/2009  . dilated cardiomyopathy   . FATTY LIVER DISEASE 02/14/2008  . Hypertension   . ICD-Medtronic 11/08/2011   generator replacment 11/14  . LBBB (left bundle branch block)   . Sinus bradycardia   . Systolic CHF, chronic ([facility])     Past Surgical History:  Procedure Laterality Date  . BI-VENTRICULAR IMPLANTABLE CARDIOVERTER DEFIBRILLATOR N/A 10/23/2013   Procedure: BI-VENTRICULAR IMPLANTABLE CARDIOVERTER DEFIBRILLATOR  (CRT-D);  Surgeon: Duke Salvia, MD;  Location: [facility] CATH LAB;  Service: Cardiovascular;  Laterality: N/A;  . CARDIAC CATHETERIZATION  09/12/2002  . IMPLANTABLE CARDIOVERTER DEFIBRILLATOR IMPLANT  2014   Medtronic Maximo 7232; gen change and RV lead revision 10-23-2013 by Dr Graciela Husbands     Current Outpatient Medications  Medication Sig Dispense Refill  . aspirin EC 81 MG tablet Take 1 tablet (81 mg total) by mouth daily. 30 tablet 11  . carvedilol (COREG) 25 MG tablet Take 1 tablet (25 mg total) by mouth 2 (two) times daily. 180 tablet 3  . ENTRESTO 49-51 MG TAKE 1 TABLET TWICE DAILY 180 tablet 3  . furosemide (LASIX) 20 MG tablet Take 1 tablet (20 mg total) by mouth as needed for edema. 90 tablet 3  . Multiple Vitamins-Minerals (MULTIVITAMIN WITH MINERALS) tablet Take 1 tablet by mouth daily.    Marland Kitchen spironolactone (ALDACTONE) 25 MG tablet TAKE 1 TABLET (25 MG TOTAL) BY MOUTH DAILY. 90 tablet 3   No current facility-administered medications for this visit.    Allergies  Allergen Reactions  . Penicillins Itching    Review of Systems negative except from HPI and PMH  Physical Exam BP 126/80   Pulse 65   Ht 5\' 8"  (1.727 m)   Wt 151 lb 3.2 oz (68.6 kg)   SpO2 98%   BMI 22.99 kg/m   Well developed and well nourished in no acute distress HENT normal Neck supple with JVP-flat Clear Device pocket well healed; without hematoma or erythema.  There is no tethering  Regular rate and rhythm, no gallop No  murmur Abd-soft with active BS No Clubbing cyanosis, no  edema Skin-warm and dry A & Oriented  Grossly normal sensory and motor function  ECG sinus @ 65 18/15/42 LBBB    Assessment and  Plan  Nonischemic cardiomyopathy    Sinus bradycardia    ICD-primary prevention     LBBB  CHF chronic systolic    High Risk Medication Surveillance    Depression   VT NS   Supraventricular tachycardia  No interval SVT so continue carvedilol 25 mg bid Cardiomyopathy is stable.  We will continue Entresto and spironolactone.  Needs surveillance laboratories Euvolemic despite the episode of dyspnea.  We will continue his furosemide at 20 mg on an as-needed basis Continue aspirin although we have begun the discussion as to whether he needs it long-term     I,Alexis Bryant,acting as a scribe for Sherryl Manges, MD.,have documented all relevant documentation on the behalf of Sherryl Manges, MD,as directed by  Sherryl Manges, MD while in the presence of Sherryl Manges, MD.  I, Sherryl Manges, MD, have reviewed all documentation for this visit. The documentation on 04/30/21 for the exam, diagnosis, procedures, and orders are all accurate and complete.

## 2021-04-30 NOTE — Patient Instructions (Signed)
Medication Instructions:  Your physician recommends that you continue on your current medications as directed. Please refer to the Current Medication list given to you today.  *If you need a refill on your cardiac medications before your next appointment, please call your pharmacy*   Lab Work: BMET today  If you have labs (blood work) drawn today and your tests are completely normal, you will receive your results only by: Marland Kitchen MyChart Message (if you have MyChart) OR . A paper copy in the mail If you have any lab test that is abnormal or we need to change your treatment, we will call you to review the results.   Testing/Procedures: None ordered.    Follow-Up: At [facility], you and your health needs are our priority.  As part of our continuing mission to provide you with exceptional heart care, we have created designated Provider Care Teams.  These Care Teams include your primary Cardiologist (physician) and Advanced Practice Providers (APPs -  Physician Assistants and Nurse Practitioners) who all work together to provide you with the care you need, when you need it.  We recommend signing up for the patient portal called "MyChart".  Sign up information is provided on this After Visit Summary.  MyChart is used to connect with patients for Virtual Visits (Telemedicine).  Patients are able to view lab/test results, encounter notes, upcoming appointments, etc.  Non-urgent messages can be sent to your provider as well.   To learn more about what you can do with MyChart, go to [email or web address].

## 2021-05-01 LAB — BASIC METABOLIC PANEL
BUN/Creatinine Ratio: 9 (ref 9–20)
BUN: 8 mg/dL (ref 6–24)
CO2: 23 mmol/L (ref 20–29)
Calcium: 9.4 mg/dL (ref 8.7–10.2)
Chloride: 102 mmol/L (ref 96–106)
Creatinine, Ser: 0.88 mg/dL (ref 0.76–1.27)
Glucose: 103 mg/dL — ABNORMAL HIGH (ref 65–99)
Potassium: 4.2 mmol/L (ref 3.5–5.2)
Sodium: 144 mmol/L (ref 134–144)
eGFR: 100 mL/min/{1.73_m2} (ref >59)

## 2021-06-09 ENCOUNTER — Ambulatory Visit (INDEPENDENT_AMBULATORY_CARE_PROVIDER_SITE_OTHER): Payer: Medicare HMO

## 2021-06-09 ENCOUNTER — Other Ambulatory Visit: Payer: Self-pay | Admitting: Cardiology

## 2021-06-09 DIAGNOSIS — I428 Other cardiomyopathies: Secondary | ICD-10-CM

## 2021-06-10 LAB — CUP PACEART REMOTE DEVICE CHECK
Battery Remaining Longevity: 52 mo
Battery Voltage: 2.97 V
Brady Statistic RV Percent Paced: 0.01 %
Date Time Interrogation Session: 20220707121626
HighPow Impedance: 70 Ohm
Implantable Lead Implant Date: 20141119
Implantable Lead Location: 753860
Implantable Pulse Generator Implant Date: 20141119
Lead Channel Impedance Value: 513 Ohm
Lead Channel Impedance Value: 589 Ohm
Lead Channel Pacing Threshold Amplitude: 0.5 V
Lead Channel Pacing Threshold Pulse Width: 0.4 ms
Lead Channel Sensing Intrinsic Amplitude: 4.5 mV
Lead Channel Sensing Intrinsic Amplitude: 4.5 mV
Lead Channel Setting Pacing Amplitude: 2.5 V
Lead Channel Setting Pacing Pulse Width: 0.4 ms
Lead Channel Setting Sensing Sensitivity: 0.3 mV

## 2021-06-30 NOTE — Progress Notes (Signed)
Remote ICD transmission.   

## 2021-09-08 ENCOUNTER — Ambulatory Visit (INDEPENDENT_AMBULATORY_CARE_PROVIDER_SITE_OTHER): Payer: Medicare HMO

## 2021-09-08 DIAGNOSIS — I428 Other cardiomyopathies: Secondary | ICD-10-CM | POA: Diagnosis not present

## 2021-09-09 LAB — CUP PACEART REMOTE DEVICE CHECK
Battery Remaining Longevity: 49 mo
Battery Voltage: 2.97 V
Brady Statistic RV Percent Paced: 0.01 %
Date Time Interrogation Session: 20221006111435
HighPow Impedance: 71 Ohm
Implantable Lead Implant Date: 20141119
Implantable Lead Location: 753860
Implantable Pulse Generator Implant Date: 20141119
Lead Channel Impedance Value: 475 Ohm
Lead Channel Impedance Value: 589 Ohm
Lead Channel Pacing Threshold Amplitude: 0.5 V
Lead Channel Pacing Threshold Pulse Width: 0.4 ms
Lead Channel Sensing Intrinsic Amplitude: 5 mV
Lead Channel Sensing Intrinsic Amplitude: 5 mV
Lead Channel Setting Pacing Amplitude: 2.5 V
Lead Channel Setting Pacing Pulse Width: 0.4 ms
Lead Channel Setting Sensing Sensitivity: 0.3 mV

## 2021-09-16 NOTE — Progress Notes (Signed)
Remote ICD transmission.   

## 2021-11-04 ENCOUNTER — Ambulatory Visit: Payer: Medicare HMO | Admitting: Cardiology

## 2021-11-04 ENCOUNTER — Other Ambulatory Visit: Payer: Self-pay

## 2021-11-04 ENCOUNTER — Encounter: Payer: Self-pay | Admitting: Cardiology

## 2021-11-04 VITALS — BP 140/78 | HR 73 | Ht 68.0 in | Wt 147.2 lb

## 2021-11-04 DIAGNOSIS — I428 Other cardiomyopathies: Secondary | ICD-10-CM

## 2021-11-04 DIAGNOSIS — Z95810 Presence of automatic (implantable) cardiac defibrillator: Secondary | ICD-10-CM

## 2021-11-04 DIAGNOSIS — I5022 Chronic systolic (congestive) heart failure: Secondary | ICD-10-CM | POA: Diagnosis not present

## 2021-11-04 DIAGNOSIS — I447 Left bundle-branch block, unspecified: Secondary | ICD-10-CM | POA: Diagnosis not present

## 2021-11-04 MED ORDER — EMPAGLIFLOZIN 10 MG PO TABS: 10.0000 mg | ORAL_TABLET | Freq: Every day | ORAL | 11 refills | Status: DC

## 2021-11-04 NOTE — Patient Instructions (Signed)
Medication Instructions:   START JARDIANCE 10 MG ONCE DAILY  *If you need a refill on your cardiac medications before your next appointment, please call your pharmacy*   Lab Work:  Your physician recommends that you return for lab work in: ONE WEEK  If you have labs (blood work) drawn today and your tests are completely normal, you will receive your results only by: [employer] (if you have MyChart) OR A paper copy in the mail If you have any lab test that is abnormal or we need to change your treatment, we will call you to review the results.   Testing/Procedures:  Your physician has requested that you have an echocardiogram. Echocardiography is a painless test that uses sound waves to create images of your heart. It provides your doctor with information about the size and shape of your heart and how well your heart's chambers and valves are working. This procedure takes approximately one hour. There are no restrictions for this procedure. [address]   Follow-Up: At [facility], you and your health needs are our priority.  As part of our continuing mission to provide you with exceptional heart care, we have created designated Provider Care Teams.  These Care Teams include your primary Cardiologist (physician) and Advanced Practice Providers (APPs -  Physician Assistants and Nurse Practitioners) who all work together to provide you with the care you need, when you need it.  We recommend signing up for the patient portal called "MyChart".  Sign up information is provided on this After Visit Summary.  MyChart is used to connect with patients for Virtual Visits (Telemedicine).  Patients are able to view lab/test results, encounter notes, upcoming appointments, etc.  Non-urgent messages can be sent to your provider as well.   To learn more about what you can do with MyChart, go to [email or web address].    Your next appointment:   6 month(s)  The format for your next  appointment:   In Person  Provider:   Olga Millers, MD

## 2021-11-04 NOTE — Progress Notes (Signed)
HPI: FU nonischemic cardiomyopathy and prior ICD. Prior cardiac catheterization in October of 2003 revealed an ejection fraction of 10% and normal coronary arteries. CPX February 2017 showed moderate functional impairment secondary to CHF. He is also being considered for upgrade to CRT. Last echo February 2021 showed ejection fraction 30 to 35%, mild left ventricular enlargement, mild left ventricular hypertrophy, mild mitral regurgitation, trace aortic insufficiency.  Since he was last seen, over the past 6 weeks he has had problems with epigastric pain after eating.  This also causes him to feel short of breath.  He otherwise denies orthopnea, pedal edema or syncope.  Current Outpatient Medications  Medication Sig Dispense Refill   aspirin EC 81 MG tablet Take 1 tablet (81 mg total) by mouth daily. 30 tablet 11   carvedilol (COREG) 25 MG tablet Take 1 tablet (25 mg total) by mouth 2 (two) times daily. 180 tablet 3   furosemide (LASIX) 20 MG tablet Take 1 tablet (20 mg total) by mouth as needed for edema. 90 tablet 3   Multiple Vitamins-Minerals (MULTIVITAMIN WITH MINERALS) tablet Take 1 tablet by mouth daily.     sacubitril-valsartan (ENTRESTO) 49-51 MG TAKE 1 TABLET TWICE DAILY 180 tablet 1   spironolactone (ALDACTONE) 25 MG tablet TAKE 1 TABLET (25 MG TOTAL) BY MOUTH DAILY. 90 tablet 1   No current facility-administered medications for this visit.     Past Medical History:  Diagnosis Date   6949 lead removed from service 11/14    ALCOHOL ABUSE 06/18/2009   dilated cardiomyopathy    FATTY LIVER DISEASE 02/14/2008   Hypertension    ICD-Medtronic 11/08/2011   generator replacment 11/14   LBBB (left bundle branch block)    Sinus bradycardia    Systolic CHF, chronic (HCC)     Past Surgical History:  Procedure Laterality Date   BI-VENTRICULAR IMPLANTABLE CARDIOVERTER DEFIBRILLATOR N/A 10/23/2013   Procedure: BI-VENTRICULAR IMPLANTABLE CARDIOVERTER DEFIBRILLATOR  (CRT-D);   Surgeon: Duke Salvia, MD;  Location: [facility] CATH LAB;  Service: Cardiovascular;  Laterality: N/A;   CARDIAC CATHETERIZATION  09/12/2002   IMPLANTABLE CARDIOVERTER DEFIBRILLATOR IMPLANT  2014   Medtronic Maximo 7232; gen change and RV lead revision 10-23-2013 by Dr Graciela Husbands    Social History   Socioeconomic History   Marital status: Married    Spouse name: Not on file   Number of children: Not on file   Years of education: Not on file   Highest education level: Not on file  Occupational History   Not on file  Tobacco Use   Smoking status: Never   Smokeless tobacco: Never  Vaping Use   Vaping Use: Never used  Substance and Sexual Activity   Alcohol use: Yes    Comment: i HAVE NOT DRANK IN 2& HALF YEARS"   Drug use: No   Sexual activity: Not on file  Other Topics Concern   Not on file  Social History Narrative   Not on file   Social Determinants of Health   Financial Resource Strain: Not on file  Food Insecurity: Not on file  Transportation Needs: Not on file  Physical Activity: Not on file  Stress: Not on file  Social Connections: Not on file  Intimate Partner Violence: Not on file    Family History  Problem Relation Age of Onset   Heart Problems Mother    Hypertension Mother    Cancer - Lung Father    Cancer - Other Father 65  liver,shoulder   Hypertension Brother    Other Son        good health   Other Son        good health   Other Daughter        good health    ROS: no fevers or chills, productive cough, hemoptysis, dysphasia, odynophagia, melena, hematochezia, dysuria, hematuria, rash, seizure activity, orthopnea, PND, pedal edema, claudication. Remaining systems are negative.  Physical Exam: Well-developed well-nourished in no acute distress.  Skin is warm and dry.  HEENT is normal.  Neck is supple.  Chest is clear to auscultation with normal expansion.  Cardiovascular exam is regular rate and rhythm.  Abdominal exam nontender or distended. No  masses palpated. Extremities show no edema. neuro grossly intact  ECG-normal sinus rhythm, left bundle branch block.  Personally reviewed  A/P  1 nonischemic cardiomyopathy-plan to continue present dose of Entresto and carvedilol.  Note his blood pressure is running high.  He will follow this at home and if it remains elevated we will advance Entresto.  Repeat echocardiogram.  2 chronic systolic congestive heart failure-continue spironolactone and Lasix at present dose.  Add jardiance 10 mg daily.  Check potassium and renal function in 1 week.  3 hypertension-blood pressure borderline.  I will ask him to track this at home and if it remains elevated we will increase Entresto.  4 ICD-Per electrophysiology.  5 epigastric pain-he is having epigastric pain after eating.  He does not have right upper quadrant tenderness on examination.  If his symptoms persist I have asked him to follow-up with primary care and he may need a GI evaluation.  Kirk Ruths, MD

## 2021-11-15 LAB — BASIC METABOLIC PANEL
BUN/Creatinine Ratio: 19 (ref 9–20)
BUN: 13 mg/dL (ref 6–24)
CO2: 25 mmol/L (ref 20–29)
Calcium: 9.4 mg/dL (ref 8.7–10.2)
Chloride: 101 mmol/L (ref 96–106)
Creatinine, Ser: 0.7 mg/dL — ABNORMAL LOW (ref 0.76–1.27)
Glucose: 115 mg/dL — ABNORMAL HIGH (ref 70–99)
Potassium: 4.3 mmol/L (ref 3.5–5.2)
Sodium: 140 mmol/L (ref 134–144)
eGFR: 107 mL/min/{1.73_m2} (ref >59)

## 2021-11-19 ENCOUNTER — Encounter: Payer: Self-pay | Admitting: *Deleted

## 2021-12-01 ENCOUNTER — Ambulatory Visit (HOSPITAL_COMMUNITY): Payer: Medicare HMO | Attending: Cardiology

## 2021-12-01 ENCOUNTER — Other Ambulatory Visit: Payer: Self-pay

## 2021-12-01 DIAGNOSIS — I428 Other cardiomyopathies: Secondary | ICD-10-CM | POA: Diagnosis not present

## 2021-12-01 LAB — ECHOCARDIOGRAM COMPLETE
Area-P 1/2: 3.33 cm2
S' Lateral: 4.2 cm

## 2021-12-07 ENCOUNTER — Encounter: Payer: Self-pay | Admitting: *Deleted

## 2021-12-08 ENCOUNTER — Ambulatory Visit (INDEPENDENT_AMBULATORY_CARE_PROVIDER_SITE_OTHER): Payer: Medicare HMO

## 2021-12-08 DIAGNOSIS — I428 Other cardiomyopathies: Secondary | ICD-10-CM

## 2021-12-08 LAB — CUP PACEART REMOTE DEVICE CHECK
Battery Remaining Longevity: 45 mo
Battery Voltage: 2.97 V
Brady Statistic RV Percent Paced: 0.01 %
Date Time Interrogation Session: 20230104112623
HighPow Impedance: 83 Ohm
Implantable Lead Implant Date: 20141119
Implantable Lead Location: 753860
Implantable Pulse Generator Implant Date: 20141119
Lead Channel Impedance Value: 513 Ohm
Lead Channel Impedance Value: 589 Ohm
Lead Channel Pacing Threshold Amplitude: 0.625 V
Lead Channel Pacing Threshold Pulse Width: 0.4 ms
Lead Channel Sensing Intrinsic Amplitude: 4.75 mV
Lead Channel Sensing Intrinsic Amplitude: 4.75 mV
Lead Channel Setting Pacing Amplitude: 2.5 V
Lead Channel Setting Pacing Pulse Width: 0.4 ms
Lead Channel Setting Sensing Sensitivity: 0.3 mV

## 2021-12-20 NOTE — Progress Notes (Signed)
Remote ICD transmission.   

## 2022-03-09 ENCOUNTER — Ambulatory Visit (INDEPENDENT_AMBULATORY_CARE_PROVIDER_SITE_OTHER): Payer: Medicare HMO

## 2022-03-09 DIAGNOSIS — I428 Other cardiomyopathies: Secondary | ICD-10-CM | POA: Diagnosis not present

## 2022-03-11 LAB — CUP PACEART REMOTE DEVICE CHECK
Battery Remaining Longevity: 41 mo
Battery Voltage: 2.96 V
Brady Statistic RV Percent Paced: 0.01 %
Date Time Interrogation Session: 20230406141452
HighPow Impedance: 81 Ohm
Implantable Lead Implant Date: 20141119
Implantable Lead Location: 753860
Implantable Pulse Generator Implant Date: 20141119
Lead Channel Impedance Value: 646 Ohm
Lead Channel Impedance Value: 722 Ohm
Lead Channel Pacing Threshold Amplitude: 0.5 V
Lead Channel Pacing Threshold Pulse Width: 0.4 ms
Lead Channel Sensing Intrinsic Amplitude: 4.625 mV
Lead Channel Sensing Intrinsic Amplitude: 4.625 mV
Lead Channel Setting Pacing Amplitude: 2.5 V
Lead Channel Setting Pacing Pulse Width: 0.4 ms
Lead Channel Setting Sensing Sensitivity: 0.3 mV

## 2022-03-24 NOTE — Progress Notes (Signed)
Remote ICD transmission.   

## 2022-04-29 ENCOUNTER — Other Ambulatory Visit: Payer: Self-pay | Admitting: Cardiology

## 2022-04-29 ENCOUNTER — Other Ambulatory Visit: Payer: Self-pay | Admitting: Internal Medicine

## 2022-04-29 DIAGNOSIS — I428 Other cardiomyopathies: Secondary | ICD-10-CM

## 2022-04-29 MED ORDER — ENTRESTO 49-51 MG PO TABS: 1.0000 | ORAL_TABLET | Freq: Two times a day (BID) | ORAL | 0 refills | Status: DC

## 2022-04-29 NOTE — Telephone Encounter (Signed)
Pt's medication was sent to pt's pharmacy as requested. Confirmation received.  °

## 2022-05-13 NOTE — Progress Notes (Signed)
HPI: FU nonischemic cardiomyopathy and prior ICD. Prior cardiac catheterization in October of 2003 revealed an ejection fraction of 10% and normal coronary arteries. CPX February 2017 showed moderate functional impairment secondary to CHF. He is also being considered for upgrade to CRT. Last echo December 2022 showed ejection fraction 35 to 40%, mild left ventricular hypertrophy and no significant valvular disease.  Since he was last seen, he has dyspnea with more vigorous activities.  No orthopnea, PND, pedal edema, exertional chest pain or syncope.  Current Outpatient Medications  Medication Sig Dispense Refill   carvedilol (COREG) 25 MG tablet TAKE 1 TABLET TWICE DAILY 180 tablet 3   empagliflozin (JARDIANCE) 10 MG TABS tablet Take 1 tablet (10 mg total) by mouth daily. 30 tablet 11   Multiple Vitamins-Minerals (MULTIVITAMIN WITH MINERALS) tablet Take 1 tablet by mouth daily.     sacubitril-valsartan (ENTRESTO) 49-51 MG Take 1 tablet by mouth 2 (two) times daily. 60 tablet 0   spironolactone (ALDACTONE) 25 MG tablet TAKE 1 TABLET EVERY DAY 90 tablet 1   aspirin EC 81 MG tablet Take 1 tablet (81 mg total) by mouth daily. (Patient not taking: Reported on 05/26/2022) 30 tablet 11   furosemide (LASIX) 20 MG tablet Take 1 tablet (20 mg total) by mouth as needed for edema. (Patient not taking: Reported on 05/26/2022) 90 tablet 3   No current facility-administered medications for this visit.     Past Medical History:  Diagnosis Date   6949 lead removed from service 11/14    ALCOHOL ABUSE 06/18/2009   dilated cardiomyopathy    FATTY LIVER DISEASE 02/14/2008   Hypertension    ICD-Medtronic 11/08/2011   generator replacment 11/14   LBBB (left bundle branch block)    Sinus bradycardia    Systolic CHF, chronic (HCC)     Past Surgical History:  Procedure Laterality Date   BI-VENTRICULAR IMPLANTABLE CARDIOVERTER DEFIBRILLATOR N/A 10/23/2013   Procedure: BI-VENTRICULAR IMPLANTABLE  CARDIOVERTER DEFIBRILLATOR  (CRT-D);  Surgeon: Duke Salvia, MD;  Location: [facility] CATH LAB;  Service: Cardiovascular;  Laterality: N/A;   CARDIAC CATHETERIZATION  09/12/2002   IMPLANTABLE CARDIOVERTER DEFIBRILLATOR IMPLANT  2014   Medtronic Maximo 7232; gen change and RV lead revision 10-23-2013 by Dr Graciela Husbands    Social History   Socioeconomic History   Marital status: Married    Spouse name: Not on file   Number of children: Not on file   Years of education: Not on file   Highest education level: Not on file  Occupational History   Not on file  Tobacco Use   Smoking status: Never   Smokeless tobacco: Never  Vaping Use   Vaping Use: Never used  Substance and Sexual Activity   Alcohol use: Yes    Comment: i HAVE NOT DRANK IN 2& HALF YEARS"   Drug use: No   Sexual activity: Not on file  Other Topics Concern   Not on file  Social History Narrative   Not on file   Social Determinants of Health   Financial Resource Strain: Not on file  Food Insecurity: Not on file  Transportation Needs: Not on file  Physical Activity: Not on file  Stress: Not on file  Social Connections: Not on file  Intimate Partner Violence: Not on file    Family History  Problem Relation Age of Onset   Heart Problems Mother    Hypertension Mother    Cancer - Lung Father    Cancer - Other Father  81       liver,shoulder   Hypertension Brother    Other Son        good health   Other Son        good health   Other Daughter        good health    ROS: no fevers or chills, productive cough, hemoptysis, dysphasia, odynophagia, melena, hematochezia, dysuria, hematuria, rash, seizure activity, orthopnea, PND, pedal edema, claudication. Remaining systems are negative.  Physical Exam: Well-developed well-nourished in no acute distress.  Skin is warm and dry.  HEENT is normal.  Neck is supple.  Chest is clear to auscultation with normal expansion.  Cardiovascular exam is regular rate and rhythm.   Abdominal exam nontender or distended. No masses palpated. Extremities show no edema. neuro grossly intact  A/P  1 nonischemic cardiomyopathy-continue Entresto and carvedilol.  He will track his blood pressure at home.  If able I will advance his Entresto dose.  2 chronic systolic congestive heart failure-continue Lasix, spironolactone and Jardiance.  Check potassium and renal function.  3 hypertension-blood pressure controlled.  Continue present medications.  4 history of ICD-followed by electrophysiology.  Olga Millers, MD

## 2022-05-26 ENCOUNTER — Encounter: Payer: Self-pay | Admitting: Cardiology

## 2022-05-26 ENCOUNTER — Ambulatory Visit (INDEPENDENT_AMBULATORY_CARE_PROVIDER_SITE_OTHER): Payer: Medicare HMO | Admitting: Cardiology

## 2022-05-26 VITALS — BP 132/78 | HR 58 | Ht 68.0 in | Wt 148.6 lb

## 2022-05-26 DIAGNOSIS — I428 Other cardiomyopathies: Secondary | ICD-10-CM | POA: Diagnosis not present

## 2022-05-26 DIAGNOSIS — I509 Heart failure, unspecified: Secondary | ICD-10-CM

## 2022-05-26 DIAGNOSIS — I5022 Chronic systolic (congestive) heart failure: Secondary | ICD-10-CM | POA: Diagnosis not present

## 2022-05-26 LAB — BASIC METABOLIC PANEL
BUN/Creatinine Ratio: 16 (ref 9–20)
BUN: 13 mg/dL (ref 6–24)
CO2: 23 mmol/L (ref 20–29)
Calcium: 9.4 mg/dL (ref 8.7–10.2)
Chloride: 102 mmol/L (ref 96–106)
Creatinine, Ser: 0.79 mg/dL (ref 0.76–1.27)
Glucose: 99 mg/dL (ref 70–99)
Potassium: 4.6 mmol/L (ref 3.5–5.2)
Sodium: 140 mmol/L (ref 134–144)
eGFR: 103 mL/min/{1.73_m2} (ref >59)

## 2022-05-26 NOTE — Patient Instructions (Signed)

## 2022-06-08 ENCOUNTER — Ambulatory Visit (INDEPENDENT_AMBULATORY_CARE_PROVIDER_SITE_OTHER): Payer: Medicare HMO

## 2022-06-08 DIAGNOSIS — I428 Other cardiomyopathies: Secondary | ICD-10-CM

## 2022-06-11 LAB — CUP PACEART REMOTE DEVICE CHECK
Battery Remaining Longevity: 38 mo
Battery Voltage: 2.96 V
Brady Statistic RV Percent Paced: 0.01 %
Date Time Interrogation Session: 20230705052516
HighPow Impedance: 75 Ohm
Implantable Lead Implant Date: 20141119
Implantable Lead Location: 753860
Implantable Pulse Generator Implant Date: 20141119
Lead Channel Impedance Value: 532 Ohm
Lead Channel Impedance Value: 608 Ohm
Lead Channel Pacing Threshold Amplitude: 0.625 V
Lead Channel Pacing Threshold Pulse Width: 0.4 ms
Lead Channel Sensing Intrinsic Amplitude: 5.25 mV
Lead Channel Sensing Intrinsic Amplitude: 5.25 mV
Lead Channel Setting Pacing Amplitude: 2.5 V
Lead Channel Setting Pacing Pulse Width: 0.4 ms
Lead Channel Setting Sensing Sensitivity: 0.3 mV

## 2022-06-29 NOTE — Progress Notes (Signed)
Remote ICD transmission.   

## 2022-07-18 ENCOUNTER — Other Ambulatory Visit: Payer: Self-pay | Admitting: Internal Medicine

## 2022-07-18 DIAGNOSIS — I428 Other cardiomyopathies: Secondary | ICD-10-CM

## 2022-09-07 ENCOUNTER — Ambulatory Visit (INDEPENDENT_AMBULATORY_CARE_PROVIDER_SITE_OTHER): Payer: Medicare HMO

## 2022-09-07 DIAGNOSIS — I428 Other cardiomyopathies: Secondary | ICD-10-CM | POA: Diagnosis not present

## 2022-09-07 LAB — CUP PACEART REMOTE DEVICE CHECK
Battery Remaining Longevity: 35 mo
Battery Voltage: 2.95 V
Brady Statistic RV Percent Paced: 0.01 %
Date Time Interrogation Session: 20231004092434
HighPow Impedance: 83 Ohm
Implantable Lead Implant Date: 20141119
Implantable Lead Location: 753860
Implantable Pulse Generator Implant Date: 20141119
Lead Channel Impedance Value: 646 Ohm
Lead Channel Impedance Value: 722 Ohm
Lead Channel Pacing Threshold Amplitude: 0.625 V
Lead Channel Pacing Threshold Pulse Width: 0.4 ms
Lead Channel Sensing Intrinsic Amplitude: 4.75 mV
Lead Channel Sensing Intrinsic Amplitude: 4.75 mV
Lead Channel Setting Pacing Amplitude: 2.5 V
Lead Channel Setting Pacing Pulse Width: 0.4 ms
Lead Channel Setting Sensing Sensitivity: 0.3 mV

## 2022-09-08 ENCOUNTER — Encounter: Payer: Self-pay | Admitting: Internal Medicine

## 2022-09-08 ENCOUNTER — Ambulatory Visit: Payer: Medicare HMO | Attending: Internal Medicine | Admitting: Internal Medicine

## 2022-09-08 VITALS — BP 126/80 | HR 76 | Ht 68.0 in | Wt 145.0 lb

## 2022-09-08 DIAGNOSIS — R001 Bradycardia, unspecified: Secondary | ICD-10-CM | POA: Diagnosis not present

## 2022-09-08 DIAGNOSIS — I428 Other cardiomyopathies: Secondary | ICD-10-CM

## 2022-09-08 DIAGNOSIS — I5022 Chronic systolic (congestive) heart failure: Secondary | ICD-10-CM

## 2022-09-08 DIAGNOSIS — I447 Left bundle-branch block, unspecified: Secondary | ICD-10-CM

## 2022-09-08 DIAGNOSIS — Z95810 Presence of automatic (implantable) cardiac defibrillator: Secondary | ICD-10-CM

## 2022-09-08 MED ORDER — FUROSEMIDE 20 MG PO TABS: 20.0000 mg | ORAL_TABLET | ORAL | 3 refills | Status: AC | PRN

## 2022-09-08 NOTE — Patient Instructions (Signed)
Medication Instructions:  Your physician recommends that you continue on your current medications as directed. Please refer to the Current Medication list given to you today.  *If you need a refill on your cardiac medications before your next appointment, please call your pharmacy*   Lab Work: None ordered.  If you have labs (blood work) drawn today and your tests are completely normal, you will receive your results only by: MyChart Message (if you have MyChart) OR A paper copy in the mail If you have any lab test that is abnormal or we need to change your treatment, we will call you to review the results.   Testing/Procedures: None ordered.    Follow-Up: At [facility], you and your health needs are our priority.  As part of our continuing mission to provide you with exceptional heart care, we have created designated Provider Care Teams.  These Care Teams include your primary Cardiologist (physician) and Advanced Practice Providers (APPs -  Physician Assistants and Nurse Practitioners) who all work together to provide you with the care you need, when you need it.  We recommend signing up for the patient portal called "MyChart".  Sign up information is provided on this After Visit Summary.  MyChart is used to connect with patients for Virtual Visits (Telemedicine).  Patients are able to view lab/test results, encounter notes, upcoming appointments, etc.  Non-urgent messages can be sent to your provider as well.   To learn more about what you can do with MyChart, go to [email or web address].    Your next appointment:   12 months with Dr Klein's PA  Important Information About Sugar       

## 2022-09-08 NOTE — Progress Notes (Signed)
Patient Care Team: Olive Bass, MD as PCP - General (Unknown Physician Specialty) Lewayne Bunting, MD as PCP - Cardiology (Cardiology)   HPI  Robert Massey is a 61 y.o. male Seen in followup for nonischemic cardiomyopathy for which he is status post ICD implantation Medtronic  11/14 he underwent generator system replacement with a new lead to replace a 6949-lead and a new generator at [facility]   There has been interval improvement in his EF so that notwithstanding his LBBB he did not undergo CRT upgrade  (45%<<10%)    DATE TEST EF   8/16 Echo   30-35 %   1/19 Echo   35 %   2/21 Echo 30-35%   12/22 Echo  35-40%     Cardiopulmonary stress test 2/17 had demonstrated a moderately limited exercise capacity.    Date Cr K Mg  2/17  0.82 4.6     2/20 0.74 4.2   3/22 0.83 5.3     6/23 0.79 4.6   The patient denies chest pain, orthopnea or peripheral edema.  There have been no palpitations  or syncope.  Complains of Orthostatic lightheadedness, fatigue with effort and some dyspnea    Past Medical History:  Diagnosis Date   6949 lead removed from service 11/14    ALCOHOL ABUSE 06/18/2009   dilated cardiomyopathy    FATTY LIVER DISEASE 02/14/2008   Hypertension    ICD-Medtronic 11/08/2011   generator replacment 11/14   LBBB (left bundle branch block)    Sinus bradycardia    Systolic CHF, chronic (HCC)     Past Surgical History:  Procedure Laterality Date   BI-VENTRICULAR IMPLANTABLE CARDIOVERTER DEFIBRILLATOR N/A 10/23/2013   Procedure: BI-VENTRICULAR IMPLANTABLE CARDIOVERTER DEFIBRILLATOR  (CRT-D);  Surgeon: Duke Salvia, MD;  Location: [facility] CATH LAB;  Service: Cardiovascular;  Laterality: N/A;   CARDIAC CATHETERIZATION  09/12/2002   IMPLANTABLE CARDIOVERTER DEFIBRILLATOR IMPLANT  2014   Medtronic Maximo 7232; gen change and RV lead revision 10-23-2013 by Dr Graciela Husbands    Current Outpatient Medications  Medication Sig Dispense Refill   carvedilol (COREG) 25 MG  tablet TAKE 1 TABLET TWICE DAILY 180 tablet 3   empagliflozin (JARDIANCE) 10 MG TABS tablet Take 1 tablet (10 mg total) by mouth daily. 30 tablet 11   furosemide (LASIX) 20 MG tablet Take 1 tablet (20 mg total) by mouth as needed for edema. 90 tablet 3   Multiple Vitamins-Minerals (MULTIVITAMIN WITH MINERALS) tablet Take 1 tablet by mouth daily.     sacubitril-valsartan (ENTRESTO) 49-51 MG Take 1 tablet by mouth 2 (two) times daily. 180 tablet 1   spironolactone (ALDACTONE) 25 MG tablet TAKE 1 TABLET EVERY DAY 90 tablet 1   No current facility-administered medications for this visit.    Allergies  Allergen Reactions   Penicillins Itching    Review of Systems negative except from HPI and PMH  Physical Exam BP 126/80   Pulse 76   Ht 5\' 8"  (1.727 m)   Wt 145 lb (65.8 kg)   SpO2 99%   BMI 22.05 kg/m   Well developed and well nourished in no acute distress HENT normal Neck supple with JVP-flat Clear Device pocket well healed; without hematoma or erythema.  There is no tethering  Regular rate and rhythm, no  gallop No murmur Abd-soft with active BS No Clubbing cyanosis  edema Skin-warm and dry A & Oriented  Grossly normal sensory and motor function  ECG sinus at 81 with P  synchronous pacing Intervals 18/12/37   ECG (preimplant) sinus at 76 Interval17/15/43 Intraventricular conduction delay with a pseudo left bundle pattern with an upright QRS lead I and upright QRS lead V6 and qr in lead L    Assessment and  Plan  Nonischemic cardiomyopathy    Sinus bradycardia    ICD-medtronic (DOI-2014) LBBA  (following failure to advance wires in the coronary sinus)  LBBB   CHF chronic systolic    Depression   VT NS   Supraventricular tachycardia  No interval tachycardia  Cardiomyopathy is stable ejection fraction remains above 35-40% continue the Entresto Aldactone and carvedilol and Farxiga  Device function is normal. programming changes none  See Paceart for  details

## 2022-09-16 NOTE — Progress Notes (Signed)
Remote ICD transmission.   

## 2022-11-10 NOTE — Progress Notes (Signed)
HPI: FU nonischemic cardiomyopathy and prior ICD. Prior cardiac catheterization in October of 2003 revealed an ejection fraction of 10% and normal coronary arteries. CPX February 2017 showed moderate functional impairment secondary to CHF. He is also being considered for upgrade to CRT. Last echo December 2022 showed ejection fraction 35 to 40%, mild left ventricular hypertrophy and no significant valvular disease.  Since he was last seen,   Current Outpatient Medications  Medication Sig Dispense Refill   carvedilol (COREG) 25 MG tablet TAKE 1 TABLET TWICE DAILY 180 tablet 3   empagliflozin (JARDIANCE) 10 MG TABS tablet Take 1 tablet (10 mg total) by mouth daily. 30 tablet 11   furosemide (LASIX) 20 MG tablet Take 1 tablet (20 mg total) by mouth as needed for edema. 90 tablet 3   Multiple Vitamins-Minerals (MULTIVITAMIN WITH MINERALS) tablet Take 1 tablet by mouth daily.     sacubitril-valsartan (ENTRESTO) 49-51 MG Take 1 tablet by mouth 2 (two) times daily. 180 tablet 1   spironolactone (ALDACTONE) 25 MG tablet TAKE 1 TABLET EVERY DAY 90 tablet 1   No current facility-administered medications for this visit.     Past Medical History:  Diagnosis Date   6949 lead removed from service 11/14    ALCOHOL ABUSE 06/18/2009   dilated cardiomyopathy    FATTY LIVER DISEASE 02/14/2008   Hypertension    ICD-Medtronic 11/08/2011   generator replacment 11/14   LBBB (left bundle branch block)    Sinus bradycardia    Systolic CHF, chronic (HCC)     Past Surgical History:  Procedure Laterality Date   BI-VENTRICULAR IMPLANTABLE CARDIOVERTER DEFIBRILLATOR N/A 10/23/2013   Procedure: BI-VENTRICULAR IMPLANTABLE CARDIOVERTER DEFIBRILLATOR  (CRT-D);  Surgeon: Duke Salvia, MD;  Location: [facility] CATH LAB;  Service: Cardiovascular;  Laterality: N/A;   CARDIAC CATHETERIZATION  09/12/2002   IMPLANTABLE CARDIOVERTER DEFIBRILLATOR IMPLANT  2014   Medtronic Maximo 7232; gen change and RV lead revision  10-23-2013 by Dr Graciela Husbands    Social History   Socioeconomic History   Marital status: Married    Spouse name: Not on file   Number of children: Not on file   Years of education: Not on file   Highest education level: Not on file  Occupational History   Not on file  Tobacco Use   Smoking status: Never   Smokeless tobacco: Never  Vaping Use   Vaping Use: Never used  Substance and Sexual Activity   Alcohol use: Yes    Comment: i HAVE NOT DRANK IN 2& HALF YEARS"   Drug use: No   Sexual activity: Not on file  Other Topics Concern   Not on file  Social History Narrative   Not on file   Social Determinants of Health   Financial Resource Strain: Not on file  Food Insecurity: Not on file  Transportation Needs: Not on file  Physical Activity: Not on file  Stress: Not on file  Social Connections: Not on file  Intimate Partner Violence: Not on file    Family History  Problem Relation Age of Onset   Heart Problems Mother    Hypertension Mother    Cancer - Lung Father    Cancer - Other Father 56       liver,shoulder   Hypertension Brother    Other Son        good health   Other Son        good health   Other Daughter  good health    ROS: no fevers or chills, productive cough, hemoptysis, dysphasia, odynophagia, melena, hematochezia, dysuria, hematuria, rash, seizure activity, orthopnea, PND, pedal edema, claudication. Remaining systems are negative.  Physical Exam: Well-developed well-nourished in no acute distress.  Skin is warm and dry.  HEENT is normal.  Neck is supple.  Chest is clear to auscultation with normal expansion.  Cardiovascular exam is regular rate and rhythm.  Abdominal exam nontender or distended. No masses palpated. Extremities show no edema. neuro grossly intact  A/P  1 nonischemic cardiomyopathy-most recent echocardiogram showed ejection fraction 35 to 40%.  Continue Entresto and carvedilol.  2 chronic systolic congestive heart  failure-patient appears to be euvolemic on examination.  Continue Lasix, spironolactone and Jardiance at present dose. Check Bmet.  3 hypertension-patient's blood pressure is controlled.  Continue present medical regimen.  4 ICD-followed by Dr. Graciela Husbands.  Olga Millers, MD

## 2022-11-17 ENCOUNTER — Encounter: Payer: Self-pay | Admitting: Cardiology

## 2022-11-17 ENCOUNTER — Ambulatory Visit: Payer: Medicare HMO | Attending: Cardiovascular Disease | Admitting: Cardiology

## 2022-11-17 VITALS — BP 126/72 | HR 70 | Ht 68.0 in | Wt 152.8 lb

## 2022-11-17 DIAGNOSIS — I10 Essential (primary) hypertension: Secondary | ICD-10-CM

## 2022-11-17 DIAGNOSIS — I428 Other cardiomyopathies: Secondary | ICD-10-CM

## 2022-11-17 DIAGNOSIS — Z95810 Presence of automatic (implantable) cardiac defibrillator: Secondary | ICD-10-CM | POA: Diagnosis not present

## 2022-11-17 DIAGNOSIS — I5022 Chronic systolic (congestive) heart failure: Secondary | ICD-10-CM

## 2022-11-17 LAB — BASIC METABOLIC PANEL
BUN/Creatinine Ratio: 18 (ref 9–20)
BUN: 14 mg/dL (ref 6–24)
CO2: 27 mmol/L (ref 20–29)
Calcium: 9.3 mg/dL (ref 8.7–10.2)
Chloride: 100 mmol/L (ref 96–106)
Creatinine, Ser: 0.79 mg/dL (ref 0.76–1.27)
Glucose: 96 mg/dL (ref 70–99)
Potassium: 4.2 mmol/L (ref 3.5–5.2)
Sodium: 141 mmol/L (ref 134–144)
eGFR: 102 mL/min/{1.73_m2} (ref >59)

## 2022-11-17 NOTE — Patient Instructions (Signed)
  Follow-Up: At  [facility], you and your health needs are our priority.  As part of our continuing mission to provide you with exceptional heart care, we have created designated Provider Care Teams.  These Care Teams include your primary Cardiologist (physician) and Advanced Practice Providers (APPs -  Physician Assistants and Nurse Practitioners) who all work together to provide you with the care you need, when you need it.  We recommend signing up for the patient portal called "MyChart".  Sign up information is provided on this After Visit Summary.  MyChart is used to connect with patients for Virtual Visits (Telemedicine).  Patients are able to view lab/test results, encounter notes, upcoming appointments, etc.  Non-urgent messages can be sent to your provider as well.   To learn more about what you can do with MyChart, go to [email or web address].    Your next appointment:   6 month(s)  The format for your next appointment:   In Person  Provider:   Brian Crenshaw, MD    

## 2022-12-07 ENCOUNTER — Ambulatory Visit (INDEPENDENT_AMBULATORY_CARE_PROVIDER_SITE_OTHER): Payer: Medicare HMO

## 2022-12-07 DIAGNOSIS — I428 Other cardiomyopathies: Secondary | ICD-10-CM

## 2022-12-08 LAB — CUP PACEART REMOTE DEVICE CHECK
Battery Remaining Longevity: 32 mo
Battery Voltage: 2.96 V
Brady Statistic RV Percent Paced: 0.01 %
Date Time Interrogation Session: 20240103093524
HighPow Impedance: 73 Ohm
Implantable Lead Connection Status: 753985
Implantable Lead Implant Date: 20141119
Implantable Lead Location: 753860
Implantable Pulse Generator Implant Date: 20141119
Lead Channel Impedance Value: 513 Ohm
Lead Channel Impedance Value: 589 Ohm
Lead Channel Pacing Threshold Amplitude: 0.5 V
Lead Channel Pacing Threshold Pulse Width: 0.4 ms
Lead Channel Sensing Intrinsic Amplitude: 4.625 mV
Lead Channel Sensing Intrinsic Amplitude: 4.625 mV
Lead Channel Setting Pacing Amplitude: 2.5 V
Lead Channel Setting Pacing Pulse Width: 0.4 ms
Lead Channel Setting Sensing Sensitivity: 0.3 mV
Zone Setting Status: 755011

## 2022-12-16 ENCOUNTER — Other Ambulatory Visit: Payer: Self-pay | Admitting: Cardiology

## 2022-12-16 DIAGNOSIS — I428 Other cardiomyopathies: Secondary | ICD-10-CM

## 2022-12-30 NOTE — Progress Notes (Signed)
Remote ICD transmission.   

## 2023-03-08 ENCOUNTER — Ambulatory Visit (INDEPENDENT_AMBULATORY_CARE_PROVIDER_SITE_OTHER): Payer: Medicare HMO

## 2023-03-08 DIAGNOSIS — I428 Other cardiomyopathies: Secondary | ICD-10-CM

## 2023-03-08 LAB — CUP PACEART REMOTE DEVICE CHECK
Battery Remaining Longevity: 30 mo
Battery Voltage: 2.95 V
Brady Statistic RV Percent Paced: 0.01 %
Date Time Interrogation Session: 20240403005306
HighPow Impedance: 78 Ohm
Implantable Lead Connection Status: 753985
Implantable Lead Implant Date: 20141119
Implantable Lead Location: 753860
Implantable Pulse Generator Implant Date: 20141119
Lead Channel Impedance Value: 513 Ohm
Lead Channel Impedance Value: 589 Ohm
Lead Channel Pacing Threshold Amplitude: 0.5 V
Lead Channel Pacing Threshold Pulse Width: 0.4 ms
Lead Channel Sensing Intrinsic Amplitude: 4.25 mV
Lead Channel Sensing Intrinsic Amplitude: 4.25 mV
Lead Channel Setting Pacing Amplitude: 2.5 V
Lead Channel Setting Pacing Pulse Width: 0.4 ms
Lead Channel Setting Sensing Sensitivity: 0.3 mV
Zone Setting Status: 755011

## 2023-04-13 NOTE — Progress Notes (Signed)
Remote ICD transmission.   

## 2023-05-15 ENCOUNTER — Other Ambulatory Visit: Payer: Self-pay | Admitting: Internal Medicine

## 2023-05-15 ENCOUNTER — Other Ambulatory Visit: Payer: Self-pay | Admitting: Cardiology

## 2023-05-15 DIAGNOSIS — I428 Other cardiomyopathies: Secondary | ICD-10-CM

## 2023-06-05 NOTE — Progress Notes (Signed)
HPI: FU nonischemic cardiomyopathy and prior ICD. Prior cardiac catheterization in October of 2003 revealed an ejection fraction of 10% and normal coronary arteries. CPX February 2017 showed moderate functional impairment secondary to CHF. He is also being considered for upgrade to CRT. Last echo December 2022 showed ejection fraction 35 to 40%, mild left ventricular hypertrophy and no significant valvular disease.  Since he was last seen, the patient has dyspnea with more extreme activities but not with routine activities. It is relieved with rest. It is not associated with chest pain. There is no orthopnea, PND or pedal edema. There is no syncope or palpitations. There is no exertional chest pain.   Current Outpatient Medications  Medication Sig Dispense Refill   carvedilol (COREG) 25 MG tablet TAKE 1 TABLET TWICE DAILY 180 tablet 1   ENTRESTO 49-51 MG TAKE 1 TABLET TWICE DAILY 180 tablet 3   furosemide (LASIX) 20 MG tablet Take 1 tablet (20 mg total) by mouth as needed for edema. 90 tablet 3   JARDIANCE 10 MG TABS tablet TAKE 1 TABLET EVERY DAY 90 tablet 3   Multiple Vitamins-Minerals (MULTIVITAMIN WITH MINERALS) tablet Take 1 tablet by mouth daily.     spironolactone (ALDACTONE) 25 MG tablet TAKE 1 TABLET EVERY DAY 90 tablet 3   No current facility-administered medications for this visit.     Past Medical History:  Diagnosis Date   6949 lead removed from service 11/14    ALCOHOL ABUSE 06/18/2009   dilated cardiomyopathy    FATTY LIVER DISEASE 02/14/2008   Hypertension    ICD-Medtronic 11/08/2011   generator replacment 11/14   LBBB (left bundle branch block)    Sinus bradycardia    Systolic CHF, chronic (HCC)     Past Surgical History:  Procedure Laterality Date   BI-VENTRICULAR IMPLANTABLE CARDIOVERTER DEFIBRILLATOR N/A 10/23/2013   Procedure: BI-VENTRICULAR IMPLANTABLE CARDIOVERTER DEFIBRILLATOR  (CRT-D);  Surgeon: Duke Salvia, MD;  Location: [facility] CATH LAB;  Service:  Cardiovascular;  Laterality: N/A;   CARDIAC CATHETERIZATION  09/12/2002   IMPLANTABLE CARDIOVERTER DEFIBRILLATOR IMPLANT  2014   Medtronic Maximo 7232; gen change and RV lead revision 10-23-2013 by Dr Graciela Husbands    Social History   Socioeconomic History   Marital status: Married    Spouse name: Not on file   Number of children: Not on file   Years of education: Not on file   Highest education level: Not on file  Occupational History   Not on file  Tobacco Use   Smoking status: Never   Smokeless tobacco: Never  Vaping Use   Vaping Use: Never used  Substance and Sexual Activity   Alcohol use: Yes    Comment: i HAVE NOT DRANK IN 2& HALF YEARS"   Drug use: No   Sexual activity: Not on file  Other Topics Concern   Not on file  Social History Narrative   Not on file   Social Determinants of Health   Financial Resource Strain: Not on file  Food Insecurity: Not on file  Transportation Needs: Not on file  Physical Activity: Not on file  Stress: Not on file  Social Connections: Not on file  Intimate Partner Violence: Not on file    Family History  Problem Relation Age of Onset   Heart Problems Mother    Hypertension Mother    Cancer - Lung Father    Cancer - Other Father 38       liver,shoulder   Hypertension Brother  Other Son        good health   Other Son        good health   Other Daughter        good health    ROS: no fevers or chills, productive cough, hemoptysis, dysphasia, odynophagia, melena, hematochezia, dysuria, hematuria, rash, seizure activity, orthopnea, PND, pedal edema, claudication. Remaining systems are negative.  Physical Exam: Well-developed well-nourished in no acute distress.  Skin is warm and dry.  HEENT is normal.  Neck is supple.  Chest is clear to auscultation with normal expansion.  Cardiovascular exam is regular rate and rhythm.  Abdominal exam nontender or distended. No masses palpated. Extremities show no edema. neuro grossly  intact  EKG Interpretation Date/Time:  Tuesday June 13 2023 10:46:27 EDT Ventricular Rate:  65 PR Interval:  172 QRS Duration:  148 QT Interval:  444 QTC Calculation: 461 R Axis:   32  Text Interpretation: Normal sinus rhythm Left bundle branch block When compared with ECG of 23-Oct-2013 06:45, QRS axis Shifted right T wave inversion now evident in Lateral leads Confirmed by Olga Millers ([id number]) on 06/13/2023 10:49:46 AM    A/P  1 nonischemic cardiomyopathy-plan to continue Entresto and carvedilol at present dose.  Repeat echocardiogram.  2 chronic systolic congestive heart failure-patient remains euvolemic.  Will continue Lasix, spironolactone and Jardiance at present dose.  Check potassium and renal function.  3 hypertension-blood pressure controlled.  Continue present medications.  4 ICD-followed by Dr. Graciela Husbands.  Olga Millers, MD

## 2023-06-07 ENCOUNTER — Ambulatory Visit (INDEPENDENT_AMBULATORY_CARE_PROVIDER_SITE_OTHER): Payer: Medicare HMO

## 2023-06-07 DIAGNOSIS — I428 Other cardiomyopathies: Secondary | ICD-10-CM | POA: Diagnosis not present

## 2023-06-07 LAB — CUP PACEART REMOTE DEVICE CHECK
Battery Remaining Longevity: 30 mo
Battery Voltage: 2.95 V
Brady Statistic RV Percent Paced: 0.01 %
Date Time Interrogation Session: 20240703074423
HighPow Impedance: 74 Ohm
Implantable Lead Connection Status: 753985
Implantable Lead Implant Date: 20141119
Implantable Lead Location: 753860
Implantable Pulse Generator Implant Date: 20141119
Lead Channel Impedance Value: 456 Ohm
Lead Channel Impedance Value: 532 Ohm
Lead Channel Pacing Threshold Amplitude: 0.625 V
Lead Channel Pacing Threshold Pulse Width: 0.4 ms
Lead Channel Sensing Intrinsic Amplitude: 5.125 mV
Lead Channel Sensing Intrinsic Amplitude: 5.125 mV
Lead Channel Setting Pacing Amplitude: 2.5 V
Lead Channel Setting Pacing Pulse Width: 0.4 ms
Lead Channel Setting Sensing Sensitivity: 0.3 mV
Zone Setting Status: 755011

## 2023-06-13 ENCOUNTER — Ambulatory Visit: Payer: Medicare HMO | Attending: Cardiology | Admitting: Cardiology

## 2023-06-13 ENCOUNTER — Encounter: Payer: Self-pay | Admitting: Cardiology

## 2023-06-13 VITALS — BP 110/60 | HR 65 | Ht 68.0 in | Wt 146.4 lb

## 2023-06-13 DIAGNOSIS — I428 Other cardiomyopathies: Secondary | ICD-10-CM | POA: Diagnosis not present

## 2023-06-13 DIAGNOSIS — I10 Essential (primary) hypertension: Secondary | ICD-10-CM | POA: Diagnosis not present

## 2023-06-13 DIAGNOSIS — Z95810 Presence of automatic (implantable) cardiac defibrillator: Secondary | ICD-10-CM

## 2023-06-13 DIAGNOSIS — I5022 Chronic systolic (congestive) heart failure: Secondary | ICD-10-CM | POA: Diagnosis not present

## 2023-06-13 NOTE — Patient Instructions (Signed)
  Testing/Procedures:  Your physician has requested that you have an echocardiogram. Echocardiography is a painless test that uses sound waves to create images of your heart. It provides your doctor with information about the size and shape of your heart and how well your heart's chambers and valves are working. This procedure takes approximately one hour. There are no restrictions for this procedure. Please do NOT wear cologne, perfume, aftershave, or lotions (deodorant is allowed). Please arrive 15 minutes prior to your appointment time. [address]   Follow-Up: At [facility], you and your health needs are our priority.  As part of our continuing mission to provide you with exceptional heart care, we have created designated Provider Care Teams.  These Care Teams include your primary Cardiologist (physician) and Advanced Practice Providers (APPs -  Physician Assistants and Nurse Practitioners) who all work together to provide you with the care you need, when you need it.  We recommend signing up for the patient portal called "MyChart".  Sign up information is provided on this After Visit Summary.  MyChart is used to connect with patients for Virtual Visits (Telemedicine).  Patients are able to view lab/test results, encounter notes, upcoming appointments, etc.  Non-urgent messages can be sent to your provider as well.   To learn more about what you can do with MyChart, go to [email or web address].    Your next appointment:   6 month(s)  Provider:   Brian Crenshaw, MD      

## 2023-06-14 LAB — BASIC METABOLIC PANEL
BUN/Creatinine Ratio: 18 (ref 9–20)
BUN: 13 mg/dL (ref 6–24)
CO2: 27 mmol/L (ref 20–29)
Calcium: 9.9 mg/dL (ref 8.7–10.2)
Chloride: 105 mmol/L (ref 96–106)
Creatinine, Ser: 0.74 mg/dL — ABNORMAL LOW (ref 0.76–1.27)
Glucose: 92 mg/dL (ref 70–99)
Potassium: 4.6 mmol/L (ref 3.5–5.2)
Sodium: 144 mmol/L (ref 134–144)
eGFR: 104 mL/min/{1.73_m2} (ref >59)

## 2023-06-29 NOTE — Progress Notes (Signed)
Remote ICD transmission.   

## 2023-07-07 ENCOUNTER — Ambulatory Visit (HOSPITAL_COMMUNITY): Payer: Medicare HMO | Attending: Cardiology

## 2023-07-07 DIAGNOSIS — I5030 Unspecified diastolic (congestive) heart failure: Secondary | ICD-10-CM

## 2023-07-07 DIAGNOSIS — I428 Other cardiomyopathies: Secondary | ICD-10-CM | POA: Insufficient documentation

## 2023-07-07 LAB — ECHOCARDIOGRAM COMPLETE
Area-P 1/2: 4.31 cm2
Calc EF: 45.6 %
S' Lateral: 3.6 cm
Single Plane A2C EF: 45.2 %
Single Plane A4C EF: 45.9 %

## 2023-09-07 ENCOUNTER — Ambulatory Visit (INDEPENDENT_AMBULATORY_CARE_PROVIDER_SITE_OTHER): Payer: Medicare HMO

## 2023-09-07 DIAGNOSIS — I428 Other cardiomyopathies: Secondary | ICD-10-CM | POA: Diagnosis not present

## 2023-09-07 LAB — CUP PACEART REMOTE DEVICE CHECK
Battery Remaining Longevity: 28 mo
Battery Voltage: 2.94 V
Brady Statistic RV Percent Paced: 0.01 %
Date Time Interrogation Session: 20241003135548
HighPow Impedance: 77 Ohm
Implantable Lead Connection Status: 753985
Implantable Lead Implant Date: 20141119
Implantable Lead Location: 753860
Implantable Pulse Generator Implant Date: 20141119
Lead Channel Impedance Value: 551 Ohm
Lead Channel Impedance Value: 608 Ohm
Lead Channel Pacing Threshold Amplitude: 0.625 V
Lead Channel Pacing Threshold Pulse Width: 0.4 ms
Lead Channel Sensing Intrinsic Amplitude: 6.125 mV
Lead Channel Sensing Intrinsic Amplitude: 6.125 mV
Lead Channel Setting Pacing Amplitude: 2.5 V
Lead Channel Setting Pacing Pulse Width: 0.4 ms
Lead Channel Setting Sensing Sensitivity: 0.3 mV
Zone Setting Status: 755011

## 2023-09-22 NOTE — Progress Notes (Signed)
Remote ICD transmission.   

## 2023-11-06 ENCOUNTER — Other Ambulatory Visit: Payer: Self-pay | Admitting: Cardiology

## 2023-11-06 DIAGNOSIS — I428 Other cardiomyopathies: Secondary | ICD-10-CM

## 2023-12-03 ENCOUNTER — Other Ambulatory Visit: Payer: Self-pay | Admitting: Cardiology

## 2023-12-03 DIAGNOSIS — I428 Other cardiomyopathies: Secondary | ICD-10-CM

## 2024-02-09 ENCOUNTER — Other Ambulatory Visit: Payer: Self-pay | Admitting: Cardiology

## 2024-02-09 DIAGNOSIS — I428 Other cardiomyopathies: Secondary | ICD-10-CM

## 2024-02-09 NOTE — Progress Notes (Signed)
 HPI: FU nonischemic cardiomyopathy and prior ICD. Prior cardiac catheterization in October of 2003 revealed an ejection fraction of 10% and normal coronary arteries. CPX February 2017 showed moderate functional impairment secondary to CHF. Echocardiogram August 2024 showed ejection fraction 40 to 45%, grade 1 diastolic dysfunction.  Since he was last seen, the patient has dyspnea with more extreme activities but not with routine activities. It is relieved with rest. It is not associated with chest pain. There is no orthopnea, PND or pedal edema. There is no syncope or palpitations. There is no exertional chest pain.   Current Outpatient Medications  Medication Sig Dispense Refill   carvedilol (COREG) 25 MG tablet TAKE 1 TABLET TWICE DAILY 180 tablet 1   ENTRESTO 49-51 MG TAKE 1 TABLET TWICE DAILY 180 tablet 3   furosemide (LASIX) 20 MG tablet Take 1 tablet (20 mg total) by mouth as needed for edema. 90 tablet 3   JARDIANCE 10 MG TABS tablet TAKE 1 TABLET EVERY DAY 90 tablet 3   Multiple Vitamins-Minerals (MULTIVITAMIN WITH MINERALS) tablet Take 1 tablet by mouth daily.     spironolactone (ALDACTONE) 25 MG tablet TAKE 1 TABLET EVERY DAY 60 tablet 0   No current facility-administered medications for this visit.     Past Medical History:  Diagnosis Date   6949 lead removed from service 11/14    ALCOHOL ABUSE 06/18/2009   dilated cardiomyopathy    FATTY LIVER DISEASE 02/14/2008   Hypertension    ICD-Medtronic 11/08/2011   generator replacment 11/14   LBBB (left bundle branch block)    Sinus bradycardia    Systolic CHF, chronic (HCC)     Past Surgical History:  Procedure Laterality Date   BI-VENTRICULAR IMPLANTABLE CARDIOVERTER DEFIBRILLATOR N/A 10/23/2013   Procedure: BI-VENTRICULAR IMPLANTABLE CARDIOVERTER DEFIBRILLATOR  (CRT-D);  Surgeon: Duke Salvia, MD;  Location: [facility] CATH LAB;  Service: Cardiovascular;  Laterality: N/A;   CARDIAC CATHETERIZATION  09/12/2002   IMPLANTABLE  CARDIOVERTER DEFIBRILLATOR IMPLANT  2014   Medtronic Maximo 7232; gen change and RV lead revision 10-23-2013 by Dr Graciela Husbands    Social History   Socioeconomic History   Marital status: Married    Spouse name: Not on file   Number of children: 3   Years of education: Not on file   Highest education level: Not on file  Occupational History   Not on file  Tobacco Use   Smoking status: Never   Smokeless tobacco: Never  Vaping Use   Vaping status: Never Used  Substance and Sexual Activity   Alcohol use: Yes    Comment: i HAVE NOT DRANK IN 2& HALF YEARS"   Drug use: No   Sexual activity: Not on file  Other Topics Concern   Not on file  Social History Narrative   Not on file   Social Drivers of Health   Financial Resource Strain: Not on file  Food Insecurity: Not on file  Transportation Needs: Not on file  Physical Activity: Not on file  Stress: Not on file  Social Connections: Not on file  Intimate Partner Violence: Not on file    Family History  Problem Relation Age of Onset   Heart Problems Mother    Hypertension Mother    Cancer - Lung Father    Cancer - Other Father 102       liver,shoulder   Hypertension Brother    Other Daughter        good health   Other Son  good health   Other Son        good health    ROS: no fevers or chills, productive cough, hemoptysis, dysphasia, odynophagia, melena, hematochezia, dysuria, hematuria, rash, seizure activity, orthopnea, PND, pedal edema, claudication. Remaining systems are negative.  Physical Exam: Well-developed well-nourished in no acute distress.  Skin is warm and dry.  HEENT is normal.  Neck is supple.  Chest is clear to auscultation with normal expansion.  Cardiovascular exam is regular rate and rhythm.  Abdominal exam nontender or distended. No masses palpated. Extremities show no edema. neuro grossly intact  ECG- personally reviewed  A/P  1 nonischemic cardiomyopathy-continue Entresto and  carvedilol at present dose.  LV function mildly reduced on most recent echocardiogram.  2 chronic systolic congestive heart failure-continue Lasix, spironolactone and Jardiance at present dose.  Check potassium and renal function.  3 hypertension-patient's blood pressure is controlled.  Continue present medical regimen.  4 status post ICD-managed by Dr. Graciela Husbands.  Olga Millers, MD

## 2024-02-23 ENCOUNTER — Encounter: Payer: Self-pay | Admitting: Cardiology

## 2024-02-23 ENCOUNTER — Ambulatory Visit: Payer: Medicare HMO | Attending: Cardiology | Admitting: Cardiology

## 2024-02-23 VITALS — BP 118/72 | HR 68 | Resp 16 | Ht 68.0 in | Wt 150.0 lb

## 2024-02-23 DIAGNOSIS — Z95810 Presence of automatic (implantable) cardiac defibrillator: Secondary | ICD-10-CM

## 2024-02-23 DIAGNOSIS — I428 Other cardiomyopathies: Secondary | ICD-10-CM

## 2024-02-23 DIAGNOSIS — I5022 Chronic systolic (congestive) heart failure: Secondary | ICD-10-CM

## 2024-02-23 DIAGNOSIS — I10 Essential (primary) hypertension: Secondary | ICD-10-CM | POA: Diagnosis not present

## 2024-02-23 LAB — BASIC METABOLIC PANEL
BUN/Creatinine Ratio: 15 (ref 10–24)
BUN: 13 mg/dL (ref 8–27)
CO2: 23 mmol/L (ref 20–29)
Calcium: 9.7 mg/dL (ref 8.6–10.2)
Chloride: 100 mmol/L (ref 96–106)
Creatinine, Ser: 0.89 mg/dL (ref 0.76–1.27)
Glucose: 118 mg/dL — ABNORMAL HIGH (ref 70–99)
Potassium: 4.8 mmol/L (ref 3.5–5.2)
Sodium: 141 mmol/L (ref 134–144)
eGFR: 98 mL/min/{1.73_m2} (ref >59)

## 2024-02-23 NOTE — Patient Instructions (Signed)
    Follow-Up: At [facility], you and your health needs are our priority.  As part of our continuing mission to provide you with exceptional heart care, we have created designated Provider Care Teams.  These Care Teams include your primary Cardiologist (physician) and Advanced Practice Providers (APPs -  Physician Assistants and Nurse Practitioners) who all work together to provide you with the care you need, when you need it.  We recommend signing up for the patient portal called "MyChart".  Sign up information is provided on this After Visit Summary.  MyChart is used to connect with patients for Virtual Visits (Telemedicine).  Patients are able to view lab/test results, encounter notes, upcoming appointments, etc.  Non-urgent messages can be sent to your provider as well.   To learn more about what you can do with MyChart, go to [email or web address].    Your next appointment:   6 month(s)  Provider:   Olga Millers, MD     Other Instructions  NEEDS FOLLOW UP DEVICE CHECK WITH DR Graciela Husbands

## 2024-03-11 ENCOUNTER — Ambulatory Visit (INDEPENDENT_AMBULATORY_CARE_PROVIDER_SITE_OTHER): Payer: Medicare HMO

## 2024-03-11 DIAGNOSIS — I428 Other cardiomyopathies: Secondary | ICD-10-CM | POA: Diagnosis not present

## 2024-03-11 DIAGNOSIS — I5022 Chronic systolic (congestive) heart failure: Secondary | ICD-10-CM

## 2024-03-12 LAB — CUP PACEART REMOTE DEVICE CHECK
Battery Remaining Longevity: 23 mo
Battery Voltage: 2.93 V
Brady Statistic RV Percent Paced: 0.01 %
Date Time Interrogation Session: 20250407052831
HighPow Impedance: 74 Ohm
Implantable Lead Connection Status: 753985
Implantable Lead Implant Date: 20141119
Implantable Lead Location: 753860
Implantable Pulse Generator Implant Date: 20141119
Lead Channel Impedance Value: 456 Ohm
Lead Channel Impedance Value: 532 Ohm
Lead Channel Pacing Threshold Amplitude: 0.75 V
Lead Channel Pacing Threshold Pulse Width: 0.4 ms
Lead Channel Sensing Intrinsic Amplitude: 5.875 mV
Lead Channel Sensing Intrinsic Amplitude: 5.875 mV
Lead Channel Setting Pacing Amplitude: 2.5 V
Lead Channel Setting Pacing Pulse Width: 0.4 ms
Lead Channel Setting Sensing Sensitivity: 0.3 mV
Zone Setting Status: 755011

## 2024-03-20 NOTE — Progress Notes (Unsigned)
  Electrophysiology Office Note:   ID:  Robert Massey, DOB [birth year], MRN [id number]  Primary Cardiologist: Alexandria Angel, MD Electrophysiologist: Richardo Chandler, MD  {Click to update primary MD,subspecialty MD or APP then REFRESH:1}    History of Present Illness:   Robert Massey is a 63 y.o. male with h/o LBBB, sinus bradycardia, NICM, and chronic systolic CHF s/p ICD seen today for routine electrophysiology followup.   Since last being seen in our clinic the patient reports doing ***.  he denies chest pain, palpitations, dyspnea, PND, orthopnea, nausea, vomiting, dizziness, syncope, edema, weight gain, or early satiety.   Review of systems complete and found to be negative unless listed in HPI.   EP Information / Studies Reviewed:    EKG is ordered today. Personal review as below.       ICD Interrogation-  reviewed in detail today,  See PACEART report.  Arrhythmia/Device History Medtronic Single Chamber ICD implanted 2014 (Lead revision and gen change a that time)  Older device info not available.   Abandoned lead. No MRI.    Physical Exam:   VS:  There were no vitals taken for this visit.   Wt Readings from Last 3 Encounters:  02/23/24 150 lb (68 kg)  06/13/23 146 lb 6.4 oz (66.4 kg)  11/17/22 152 lb 12.8 oz (69.3 kg)     GEN: No acute distress *** NECK: No JVD; No carotid bruits CARDIAC: {EPRHYTHM:28826}, no murmurs, rubs, gallops RESPIRATORY:  Clear to auscultation without rales, wheezing or rhonchi  ABDOMEN: Soft, non-tender, non-distended EXTREMITIES:  {EDEMA LEVEL:28147::"No"} edema; No deformity   ASSESSMENT AND PLAN:    Chronic systolic CHF  s/p Medtronic single chamber ICD  LBBB NICM euvolemic today Stable on an appropriate medical regimen Normal ICD function See Pace Art report No changes today  Abandoned Lead Not MRI candidate  HTN Stable on current regimen   Disposition:   Follow up with {EPPROVIDERS:28135} {EPFOLLOW  UP:28173}   Signed, Tylene Galla, PA-C

## 2024-03-21 ENCOUNTER — Encounter: Payer: Self-pay | Admitting: Student

## 2024-03-21 ENCOUNTER — Ambulatory Visit: Attending: Student | Admitting: Student

## 2024-03-21 VITALS — BP 110/80 | HR 59 | Ht 68.0 in | Wt 153.0 lb

## 2024-03-21 DIAGNOSIS — I447 Left bundle-branch block, unspecified: Secondary | ICD-10-CM

## 2024-03-21 DIAGNOSIS — I10 Essential (primary) hypertension: Secondary | ICD-10-CM | POA: Diagnosis not present

## 2024-03-21 DIAGNOSIS — Z95810 Presence of automatic (implantable) cardiac defibrillator: Secondary | ICD-10-CM | POA: Diagnosis not present

## 2024-03-21 DIAGNOSIS — I5022 Chronic systolic (congestive) heart failure: Secondary | ICD-10-CM

## 2024-03-21 DIAGNOSIS — I428 Other cardiomyopathies: Secondary | ICD-10-CM | POA: Diagnosis not present

## 2024-03-21 LAB — CUP PACEART INCLINIC DEVICE CHECK
Battery Remaining Longevity: 23 mo
Battery Voltage: 2.93 V
Brady Statistic RV Percent Paced: 0.01 %
Date Time Interrogation Session: 20250417091609
HighPow Impedance: 78 Ohm
Implantable Lead Connection Status: 753985
Implantable Lead Implant Date: 20141119
Implantable Lead Location: 753860
Implantable Pulse Generator Implant Date: 20141119
Lead Channel Impedance Value: 475 Ohm
Lead Channel Impedance Value: 551 Ohm
Lead Channel Pacing Threshold Amplitude: 0.625 V
Lead Channel Pacing Threshold Pulse Width: 0.4 ms
Lead Channel Sensing Intrinsic Amplitude: 4.75 mV
Lead Channel Sensing Intrinsic Amplitude: 6.125 mV
Lead Channel Setting Pacing Amplitude: 2.5 V
Lead Channel Setting Pacing Pulse Width: 0.4 ms
Lead Channel Setting Sensing Sensitivity: 0.3 mV
Zone Setting Status: 755011

## 2024-03-21 NOTE — Patient Instructions (Signed)
 Medication Instructions:  Your physician recommends that you continue on your current medications as directed. Please refer to the Current Medication list given to you today.  *If you need a refill on your cardiac medications before your next appointment, please call your pharmacy*  Lab Work: None ordered If you have labs (blood work) drawn today and your tests are completely normal, you will receive your results only by: MyChart Message (if you have MyChart) OR A paper copy in the mail If you have any lab test that is abnormal or we need to change your treatment, we will call you to review the results.  Follow-Up: At [facility], you and your health needs are our priority.  As part of our continuing mission to provide you with exceptional heart care, our providers are all part of one team.  This team includes your primary Cardiologist (physician) and Advanced Practice Providers or APPs (Physician Assistants and Nurse Practitioners) who all work together to provide you with the care you need, when you need it.  Your next appointment:   1 year(s)  Provider:   Casimiro Needle "Mardelle Matte" Lanna Poche, PA-C       1st Floor: - Lobby - Registration  - Pharmacy  - Lab - Cafe  2nd Floor: - PV Lab - Diagnostic Testing (echo, CT, nuclear med)  3rd Floor: - Vacant  4th Floor: - TCTS (cardiothoracic surgery) - AFib Clinic - Structural Heart Clinic - Vascular Surgery  - Vascular Ultrasound  5th Floor: - [facility] (general and EP) - Clinical Pharmacy for coumadin, hypertension, lipid, weight-loss medications, and med management appointments    Valet parking services will be available as well.

## 2024-03-23 ENCOUNTER — Encounter: Payer: Self-pay | Admitting: Internal Medicine

## 2024-04-24 ENCOUNTER — Telehealth: Payer: Self-pay

## 2024-04-24 NOTE — Telephone Encounter (Signed)
 error

## 2024-04-24 NOTE — Progress Notes (Signed)
 Remote ICD transmission.

## 2024-04-24 NOTE — Addendum Note (Signed)
 Addended by: Edra Govern D on: 04/24/2024 10:31 AM   Modules accepted: Orders

## 2024-05-28 ENCOUNTER — Other Ambulatory Visit: Payer: Self-pay | Admitting: Cardiology

## 2024-05-28 DIAGNOSIS — I428 Other cardiomyopathies: Secondary | ICD-10-CM

## 2024-06-11 ENCOUNTER — Ambulatory Visit: Payer: Medicare HMO

## 2024-06-11 DIAGNOSIS — I428 Other cardiomyopathies: Secondary | ICD-10-CM

## 2024-06-13 LAB — CUP PACEART REMOTE DEVICE CHECK
Battery Remaining Longevity: 20 mo
Battery Voltage: 2.91 V
Brady Statistic RV Percent Paced: 0.01 %
Date Time Interrogation Session: 20250709163113
HighPow Impedance: 76 Ohm
Implantable Lead Connection Status: 753985
Implantable Lead Implant Date: 20141119
Implantable Lead Location: 753860
Implantable Pulse Generator Implant Date: 20141119
Lead Channel Impedance Value: 513 Ohm
Lead Channel Impedance Value: 551 Ohm
Lead Channel Pacing Threshold Amplitude: 0.75 V
Lead Channel Pacing Threshold Pulse Width: 0.4 ms
Lead Channel Sensing Intrinsic Amplitude: 3.875 mV
Lead Channel Sensing Intrinsic Amplitude: 3.875 mV
Lead Channel Setting Pacing Amplitude: 2.5 V
Lead Channel Setting Pacing Pulse Width: 0.4 ms
Lead Channel Setting Sensing Sensitivity: 0.3 mV
Zone Setting Status: 755011

## 2024-06-20 ENCOUNTER — Ambulatory Visit: Payer: Self-pay | Admitting: Cardiology

## 2024-07-13 ENCOUNTER — Other Ambulatory Visit: Payer: Self-pay | Admitting: Internal Medicine

## 2024-07-13 DIAGNOSIS — I428 Other cardiomyopathies: Secondary | ICD-10-CM

## 2024-07-16 ENCOUNTER — Telehealth: Payer: Self-pay | Admitting: Internal Medicine

## 2024-07-16 DIAGNOSIS — I428 Other cardiomyopathies: Secondary | ICD-10-CM

## 2024-07-16 NOTE — Telephone Encounter (Signed)
*  STAT* If patient is at the pharmacy, call can be transferred to refill team.   1. Which medications need to be refilled? (please list name of each medication and dose if known)   ENTRESTO  49-51 MG     2. Would you like to learn more about the convenience, safety, & potential cost savings by using the [facility]? No    3. Are you open to using the [facility] (Type [facility].  ) No   4. Which pharmacy/location (including street and city if local pharmacy) is medication to be sent to?  [facility] - [place], [place] - [address]       5. Do they need a 30 day or 90 day supply? 90 day

## 2024-07-17 NOTE — Telephone Encounter (Signed)
 Rx already filled 8/12

## 2024-08-16 ENCOUNTER — Encounter: Payer: Self-pay | Admitting: Cardiology

## 2024-09-11 ENCOUNTER — Ambulatory Visit: Payer: Medicare HMO

## 2024-09-11 DIAGNOSIS — I428 Other cardiomyopathies: Secondary | ICD-10-CM

## 2024-09-12 LAB — CUP PACEART REMOTE DEVICE CHECK
Battery Remaining Longevity: 19 mo
Battery Voltage: 2.91 V
Brady Statistic RV Percent Paced: 0.01 %
Date Time Interrogation Session: 20251008002208
HighPow Impedance: 77 Ohm
Implantable Lead Connection Status: 753985
Implantable Lead Implant Date: 20141119
Implantable Lead Location: 753860
Implantable Pulse Generator Implant Date: 20141119
Lead Channel Impedance Value: 475 Ohm
Lead Channel Impedance Value: 589 Ohm
Lead Channel Pacing Threshold Amplitude: 0.625 V
Lead Channel Pacing Threshold Pulse Width: 0.4 ms
Lead Channel Sensing Intrinsic Amplitude: 4.375 mV
Lead Channel Sensing Intrinsic Amplitude: 4.375 mV
Lead Channel Setting Pacing Amplitude: 2.5 V
Lead Channel Setting Pacing Pulse Width: 0.4 ms
Lead Channel Setting Sensing Sensitivity: 0.3 mV
Zone Setting Status: 755011

## 2024-09-13 NOTE — Progress Notes (Signed)
 Remote ICD Transmission

## 2024-09-15 ENCOUNTER — Ambulatory Visit: Payer: Self-pay | Admitting: Cardiology

## 2024-09-16 NOTE — Progress Notes (Signed)
 Remote ICD Transmission

## 2024-12-09 NOTE — Progress Notes (Signed)
 "    HPI: FU nonischemic cardiomyopathy and prior ICD. Prior cardiac catheterization in October of 2003 revealed an ejection fraction of 10% and normal coronary arteries. CPX February 2017 showed moderate functional impairment secondary to CHF. Echocardiogram August 2024 showed ejection fraction 40 to 45%, grade 1 diastolic dysfunction.  Since he was last seen, patient denies increased dyspnea, chest pain, palpitations or syncope.  Current Outpatient Medications  Medication Sig Dispense Refill   carvedilol  (COREG ) 25 MG tablet TAKE 1 TABLET TWICE DAILY 180 tablet 3   furosemide  (LASIX ) 20 MG tablet Take 1 tablet (20 mg total) by mouth as needed for edema. 90 tablet 3   JARDIANCE  10 MG TABS tablet TAKE 1 TABLET EVERY DAY 90 tablet 3   Multiple Vitamins-Minerals (MULTIVITAMIN WITH MINERALS) tablet Take 1 tablet by mouth daily.     sacubitril -valsartan  (ENTRESTO ) 49-51 MG TAKE 1 TABLET TWICE DAILY 180 tablet 2   spironolactone  (ALDACTONE ) 25 MG tablet Take 1 tablet (25 mg total) by mouth daily. 90 tablet 3   No current facility-administered medications for this visit.     Past Medical History:  Diagnosis Date   6949 lead removed from service 11/14    ALCOHOL ABUSE 06/18/2009   dilated cardiomyopathy    FATTY LIVER DISEASE 02/14/2008   Hypertension    ICD-Medtronic 11/08/2011   generator replacment 11/14   LBBB (left bundle branch block)    Sinus bradycardia    Systolic CHF, chronic (HCC)     Past Surgical History:  Procedure Laterality Date   BI-VENTRICULAR IMPLANTABLE CARDIOVERTER DEFIBRILLATOR N/A 10/23/2013   Procedure: BI-VENTRICULAR IMPLANTABLE CARDIOVERTER DEFIBRILLATOR  (CRT-D);  Surgeon: Elspeth JAYSON Sage, MD;  Location: [facility] CATH LAB;  Service: Cardiovascular;  Laterality: N/A;   CARDIAC CATHETERIZATION  09/12/2002   IMPLANTABLE CARDIOVERTER DEFIBRILLATOR IMPLANT  2014   Medtronic Maximo 7232; gen change and RV lead revision 10-23-2013 by Dr Sage    Social History    Socioeconomic History   Marital status: Married    Spouse name: Not on file   Number of children: 3   Years of education: Not on file   Highest education level: Not on file  Occupational History   Not on file  Tobacco Use   Smoking status: Never   Smokeless tobacco: Never  Vaping Use   Vaping status: Never Used  Substance and Sexual Activity   Alcohol use: Yes    Comment: i HAVE NOT DRANK IN 2& HALF YEARS   Drug use: No   Sexual activity: Not on file  Other Topics Concern   Not on file  Social History Narrative   Not on file   Social Drivers of Health   Tobacco Use: Low Risk (12/18/2024)   Patient History    Smoking Tobacco Use: Never    Smokeless Tobacco Use: Never    Passive Exposure: Not on file  Financial Resource Strain: Not on file  Food Insecurity: Not on file  Transportation Needs: Not on file  Physical Activity: Not on file  Stress: Not on file  Social Connections: Not on file  Intimate Partner Violence: Not on file  Depression (EYV7-0): Not on file  Alcohol Screen: Not on file  Housing: Not on file  Utilities: Not on file  Health Literacy: Not on file    Family History  Problem Relation Age of Onset   Heart Problems Mother    Hypertension Mother    Cancer - Lung Father    Cancer - Other Father 26  liver,shoulder   Hypertension Brother    Other Daughter        good health   Other Son        good health   Other Son        good health    ROS: no fevers or chills, productive cough, hemoptysis, dysphasia, odynophagia, melena, hematochezia, dysuria, hematuria, rash, seizure activity, orthopnea, PND, pedal edema, claudication. Remaining systems are negative.  Physical Exam: Well-developed well-nourished in no acute distress.  Skin is warm and dry.  HEENT is normal.  Neck is supple.  Chest is clear to auscultation with normal expansion.  Cardiovascular exam is regular rate and rhythm.  Abdominal exam nontender or distended. No masses  palpated. Extremities show no edema. neuro grossly intact  EKG Interpretation Date/Time:  Wednesday December 18 2024 10:27:01 EST Ventricular Rate:  77 PR Interval:  176 QRS Duration:  140 QT Interval:  406 QTC Calculation: 459 R Axis:   85  Text Interpretation: Normal sinus rhythm with sinus arrhythmia Left bundle branch block Confirmed by Pietro Rogue ([id number]) on 12/18/2024 10:28:00 AM    A/P  1 nonischemic cardiomyopathy-continue carvedilol , Entresto  at present dose.  Repeat echocardiogram.  2 chronic systolic congestive heart failure-continue Jardiance , spironolactone  and Lasix  at present dose.  Check potassium and renal function.  3 hypertension-blood pressure controlled.  Continue present medical regimen and follow.  4 status post ICD-per EP.  Rogue Pietro, MD    "

## 2024-12-11 ENCOUNTER — Ambulatory Visit

## 2024-12-11 DIAGNOSIS — I428 Other cardiomyopathies: Secondary | ICD-10-CM | POA: Diagnosis not present

## 2024-12-12 ENCOUNTER — Ambulatory Visit: Payer: Self-pay | Admitting: Cardiology

## 2024-12-12 LAB — CUP PACEART REMOTE DEVICE CHECK
Battery Remaining Longevity: 15 mo
Battery Voltage: 2.9 V
Brady Statistic RV Percent Paced: 0.01 %
Date Time Interrogation Session: 20260108035307
HighPow Impedance: 71 Ohm
Implantable Lead Connection Status: 753985
Implantable Lead Implant Date: 20141119
Implantable Lead Location: 753860
Implantable Pulse Generator Implant Date: 20141119
Lead Channel Impedance Value: 456 Ohm
Lead Channel Impedance Value: 532 Ohm
Lead Channel Pacing Threshold Amplitude: 0.625 V
Lead Channel Pacing Threshold Pulse Width: 0.4 ms
Lead Channel Sensing Intrinsic Amplitude: 3.875 mV
Lead Channel Sensing Intrinsic Amplitude: 3.875 mV
Lead Channel Setting Pacing Amplitude: 2.5 V
Lead Channel Setting Pacing Pulse Width: 0.4 ms
Lead Channel Setting Sensing Sensitivity: 0.3 mV
Zone Setting Status: 755011

## 2024-12-16 NOTE — Progress Notes (Signed)
 Remote ICD Transmission

## 2024-12-18 ENCOUNTER — Encounter: Payer: Self-pay | Admitting: Cardiology

## 2024-12-18 ENCOUNTER — Ambulatory Visit: Attending: Cardiology | Admitting: Cardiology

## 2024-12-18 VITALS — BP 106/62 | HR 77 | Ht 68.0 in | Wt 150.0 lb

## 2024-12-18 DIAGNOSIS — I5022 Chronic systolic (congestive) heart failure: Secondary | ICD-10-CM | POA: Diagnosis not present

## 2024-12-18 DIAGNOSIS — I447 Left bundle-branch block, unspecified: Secondary | ICD-10-CM | POA: Diagnosis not present

## 2024-12-18 DIAGNOSIS — I428 Other cardiomyopathies: Secondary | ICD-10-CM

## 2024-12-18 DIAGNOSIS — Z95810 Presence of automatic (implantable) cardiac defibrillator: Secondary | ICD-10-CM | POA: Diagnosis not present

## 2024-12-18 DIAGNOSIS — I10 Essential (primary) hypertension: Secondary | ICD-10-CM

## 2024-12-18 NOTE — Patient Instructions (Signed)

## 2024-12-19 ENCOUNTER — Ambulatory Visit: Payer: Self-pay | Admitting: Cardiology

## 2024-12-19 LAB — BASIC METABOLIC PANEL WITH GFR
BUN/Creatinine Ratio: 18 (ref 10–24)
BUN: 14 mg/dL (ref 8–27)
CO2: 25 mmol/L (ref 20–29)
Calcium: 9.4 mg/dL (ref 8.6–10.2)
Chloride: 102 mmol/L (ref 96–106)
Creatinine, Ser: 0.78 mg/dL (ref 0.76–1.27)
Glucose: 102 mg/dL — ABNORMAL HIGH (ref 70–99)
Potassium: 4.7 mmol/L (ref 3.5–5.2)
Sodium: 141 mmol/L (ref 134–144)
eGFR: 101 mL/min/1.73

## 2025-01-16 ENCOUNTER — Ambulatory Visit (HOSPITAL_COMMUNITY): Admission: RE | Admit: 2025-01-16 | Source: Ambulatory Visit

## 2025-03-12 ENCOUNTER — Encounter

## 2025-03-19 ENCOUNTER — Ambulatory Visit: Admitting: Student

## 2025-06-11 ENCOUNTER — Encounter

## 2025-09-10 ENCOUNTER — Encounter

## 2025-12-10 ENCOUNTER — Encounter

## 2026-03-11 ENCOUNTER — Encounter

## 2026-06-10 ENCOUNTER — Encounter

## 2026-09-09 ENCOUNTER — Encounter
